# Patient Record
Sex: Female | Born: 1940 | Race: White | Hispanic: No | Smoking: Former smoker
Health system: Southern US, Community
[De-identification: ages and names within clinical notes are randomized; demographics above are authoritative.]

## PROBLEM LIST (undated history)

## (undated) DIAGNOSIS — R419 Unspecified symptoms and signs involving cognitive functions and awareness: Secondary | ICD-10-CM

## (undated) DIAGNOSIS — M1990 Unspecified osteoarthritis, unspecified site: Secondary | ICD-10-CM

## (undated) DIAGNOSIS — E039 Hypothyroidism, unspecified: Secondary | ICD-10-CM

## (undated) DIAGNOSIS — Z87891 Personal history of nicotine dependence: Secondary | ICD-10-CM

## (undated) DIAGNOSIS — IMO0002 Reserved for concepts with insufficient information to code with codable children: Secondary | ICD-10-CM

## (undated) DIAGNOSIS — M549 Dorsalgia, unspecified: Secondary | ICD-10-CM

## (undated) DIAGNOSIS — E079 Disorder of thyroid, unspecified: Secondary | ICD-10-CM

## (undated) DIAGNOSIS — E785 Hyperlipidemia, unspecified: Secondary | ICD-10-CM

## (undated) DIAGNOSIS — Z8619 Personal history of other infectious and parasitic diseases: Secondary | ICD-10-CM

## (undated) DIAGNOSIS — G4700 Insomnia, unspecified: Secondary | ICD-10-CM

## (undated) HISTORY — PX: VARICOSE VEIN SURGERY: SHX832

## (undated) HISTORY — DX: Personal history of other infectious and parasitic diseases: Z86.19

## (undated) HISTORY — PX: WISDOM TOOTH EXTRACTION: SHX21

## (undated) HISTORY — DX: Insomnia, unspecified: G47.00

## (undated) HISTORY — DX: Dorsalgia, unspecified: M54.9

## (undated) HISTORY — DX: Personal history of nicotine dependence: Z87.891

## (undated) HISTORY — DX: Hypothyroidism, unspecified: E03.9

## (undated) HISTORY — DX: Unspecified osteoarthritis, unspecified site: M19.90

## (undated) HISTORY — PX: ABDOMINAL HYSTERECTOMY: SHX81

## (undated) HISTORY — DX: Hyperlipidemia, unspecified: E78.5

## (undated) HISTORY — PX: FRACTURE SURGERY: SHX138

## (undated) HISTORY — PX: TONSILLECTOMY: SHX5217

## (undated) HISTORY — DX: Disorder of thyroid, unspecified: E07.9

## (undated) HISTORY — DX: Reserved for concepts with insufficient information to code with codable children: IMO0002

---

## 2003-10-23 HISTORY — PX: OTHER SURGICAL HISTORY: SHX169

## 2013-08-04 DIAGNOSIS — M25561 Pain in right knee: Secondary | ICD-10-CM | POA: Insufficient documentation

## 2013-08-18 DIAGNOSIS — M1990 Unspecified osteoarthritis, unspecified site: Secondary | ICD-10-CM | POA: Insufficient documentation

## 2014-01-01 DIAGNOSIS — E079 Disorder of thyroid, unspecified: Secondary | ICD-10-CM

## 2014-01-01 HISTORY — DX: Disorder of thyroid, unspecified: E07.9

## 2014-03-26 ENCOUNTER — Other Ambulatory Visit: Payer: Self-pay | Admitting: Family Medicine

## 2014-03-26 ENCOUNTER — Encounter: Payer: Self-pay | Admitting: Family Medicine

## 2014-03-26 ENCOUNTER — Telehealth: Payer: Self-pay | Admitting: Family Medicine

## 2014-03-26 ENCOUNTER — Ambulatory Visit (INDEPENDENT_AMBULATORY_CARE_PROVIDER_SITE_OTHER): Payer: Medicare HMO | Admitting: Family Medicine

## 2014-03-26 VITALS — BP 140/82 | HR 73 | Temp 97.9°F | Ht 61.75 in | Wt 128.0 lb

## 2014-03-26 DIAGNOSIS — Z0000 Encounter for general adult medical examination without abnormal findings: Secondary | ICD-10-CM | POA: Insufficient documentation

## 2014-03-26 DIAGNOSIS — Z1231 Encounter for screening mammogram for malignant neoplasm of breast: Secondary | ICD-10-CM

## 2014-03-26 DIAGNOSIS — M129 Arthropathy, unspecified: Secondary | ICD-10-CM

## 2014-03-26 DIAGNOSIS — D649 Anemia, unspecified: Secondary | ICD-10-CM | POA: Insufficient documentation

## 2014-03-26 DIAGNOSIS — IMO0002 Reserved for concepts with insufficient information to code with codable children: Secondary | ICD-10-CM

## 2014-03-26 DIAGNOSIS — Z23 Encounter for immunization: Secondary | ICD-10-CM

## 2014-03-26 DIAGNOSIS — E039 Hypothyroidism, unspecified: Secondary | ICD-10-CM

## 2014-03-26 DIAGNOSIS — R5381 Other malaise: Secondary | ICD-10-CM | POA: Insufficient documentation

## 2014-03-26 DIAGNOSIS — Z87891 Personal history of nicotine dependence: Secondary | ICD-10-CM

## 2014-03-26 DIAGNOSIS — R5383 Other fatigue: Secondary | ICD-10-CM

## 2014-03-26 DIAGNOSIS — Z780 Asymptomatic menopausal state: Secondary | ICD-10-CM

## 2014-03-26 DIAGNOSIS — G4700 Insomnia, unspecified: Secondary | ICD-10-CM

## 2014-03-26 DIAGNOSIS — M1990 Unspecified osteoarthritis, unspecified site: Secondary | ICD-10-CM

## 2014-03-26 HISTORY — DX: Personal history of nicotine dependence: Z87.891

## 2014-03-26 HISTORY — DX: Reserved for concepts with insufficient information to code with codable children: IMO0002

## 2014-03-26 HISTORY — DX: Insomnia, unspecified: G47.00

## 2014-03-26 LAB — RENAL FUNCTION PANEL
ALBUMIN: 4.1 g/dL (ref 3.5–5.2)
BUN: 21 mg/dL (ref 6–23)
CHLORIDE: 104 meq/L (ref 96–112)
CO2: 28 mEq/L (ref 19–32)
Calcium: 8.9 mg/dL (ref 8.4–10.5)
Creat: 0.61 mg/dL (ref 0.50–1.10)
Glucose, Bld: 86 mg/dL (ref 70–99)
PHOSPHORUS: 3 mg/dL (ref 2.3–4.6)
Potassium: 3.9 mEq/L (ref 3.5–5.3)
SODIUM: 142 meq/L (ref 135–145)

## 2014-03-26 LAB — CBC
HCT: 39.5 % (ref 36.0–46.0)
Hemoglobin: 13.2 g/dL (ref 12.0–15.0)
MCH: 29.8 pg (ref 26.0–34.0)
MCHC: 33.4 g/dL (ref 30.0–36.0)
MCV: 89.2 fL (ref 78.0–100.0)
PLATELETS: 353 10*3/uL (ref 150–400)
RBC: 4.43 MIL/uL (ref 3.87–5.11)
RDW: 13.4 % (ref 11.5–15.5)
WBC: 10.4 10*3/uL (ref 4.0–10.5)

## 2014-03-26 MED ORDER — PNEUMOCOCCAL 13-VAL CONJ VACC IM SUSP: 0.5000 mL | Freq: Once | INTRAMUSCULAR | Status: DC

## 2014-03-26 MED ORDER — TRAMADOL HCL 50 MG PO TABS: 100.0000 mg | ORAL_TABLET | Freq: Four times a day (QID) | ORAL | Status: DC | PRN

## 2014-03-26 MED ORDER — LEVOTHYROXINE SODIUM 75 MCG PO TABS: 75.0000 ug | ORAL_TABLET | Freq: Every day | ORAL | Status: DC

## 2014-03-26 NOTE — Assessment & Plan Note (Signed)
Check a CBC

## 2014-03-26 NOTE — Assessment & Plan Note (Addendum)
Check a Dexa Scan. Having hot flashes, has tried estrogen in past without good results. Would discourage this until some screening is done anyway. Encouraged Icool, small, frequent meals with lean proteins and minimize simple carbs. Increase exercise

## 2014-03-26 NOTE — Assessment & Plan Note (Addendum)
Started smoking at 80 and quit at 83 after 1 1/2 ppd history. Uses an electronic cig bid, encouraged complete cessation.  Encouraged to proceed with MGM, Dexa scan and colonoscopy due to increased risk of disease with smoking history

## 2014-03-26 NOTE — Assessment & Plan Note (Signed)
Has done very little screening in her life. Reports 1 mgm roughly 10 years ago was normal. Has never had a colonoscopy, DEXA scan or cholesterol check. Declines colonoscopy. Agrees to DEXA scan and mammogram prior to next visit. We'll consider colonoscopy to be ordered at next visit after long discussion. Agrees to return for well-nourished check and Pap smear in roughly 3 months. Agrees to fasting labs at that time. Given Prevnar shot today. Agrees to Zostavax at next visit.

## 2014-03-26 NOTE — Progress Notes (Signed)
Pre visit review using our clinic review tool, if applicable. No additional management support is needed unless otherwise documented below in the visit note. 

## 2014-03-26 NOTE — Assessment & Plan Note (Signed)
Check TSH, CBC and renal. Increase exercise, eat heart healthy diet

## 2014-03-26 NOTE — Progress Notes (Signed)
Patient ID: Katherine Griffith, female   DOB: Oct 17, 1941, 73 y.o.   MRN: [id number] Katherine Griffith [id number] 01-Dec-1940 03/26/2014      Progress Note New Patient  Subjective  Chief Complaint  Chief Complaint  Patient presents with  . Establish Care    new patient  . Injections    prevnar    HPI  Patient is a 73 year old female in today for routine medical care. Patient is in today to establish care. She has been in the area for roughly 4-5 years after moving here from [place] to live with her daughter. She has been seeing an urgent medical care group as needed but is ready to establish care. Has not historically had ongoing primary care and has done very little screening in her past. Today her greatest complaints are of excessive fatigue and frequent hot flashes. She reports trying estrogen in the past with no good relief. No other acute complaints at the present time. She is in need of medication refills and does note chronic joint pain in her lower extremities for which tramadol is helpful. Denies CP/palp/SOB/HA/congestion/fevers/GI or GU c/o. Taking meds as prescribed  Past Medical History  Diagnosis Date  . Thyroid disease 01-01-14  . Arthritis   . History of chicken pox   . H/O measles   . H/O mumps     Past Surgical History  Procedure Laterality Date  . Abdominal hysterectomy  73 yrs old    partial  . Herniated disc  2005  . Tonsillectomy  51 yrs ago  . Wisdom tooth extraction    . Varicose vein surgery Bilateral     Family History  Problem Relation Age of Onset  . Cancer Father   . Cataracts Paternal Grandmother     History   Social History  . Marital Status: Divorced    Spouse Name: N/A    Number of Children: N/A  . Years of Education: N/A   Occupational History  . Not on file.   Social History Main Topics  . Smoking status: Former Smoker -- 1.50 packs/day for 53 years    Types: Cigarettes    Start date: 10/22/2010  . Smokeless tobacco: Not on file  .  Alcohol Use: No  . Drug Use: No  . Sexual Activity: Not Currently     Comment: lives with Daughter, Katherine Griffith, no dietary restrictions.   Other Topics Concern  . Not on file   Social History Narrative  . No narrative on file    No current outpatient prescriptions on file prior to visit.   No current facility-administered medications on file prior to visit.    Allergies  Allergen Reactions  . Codeine Nausea Only    Review of Systems  Review of Systems  Constitutional: Positive for malaise/fatigue. Negative for fever and chills.  HENT: Negative for congestion, hearing loss and nosebleeds.   Eyes: Negative for discharge.  Respiratory: Negative for cough, sputum production, shortness of breath and wheezing.   Cardiovascular: Negative for chest pain, palpitations and leg swelling.  Gastrointestinal: Negative for heartburn, nausea, vomiting, abdominal pain, diarrhea, constipation and blood in stool.  Genitourinary: Negative for dysuria, urgency, frequency and hematuria.  Musculoskeletal: Negative for back pain, falls and myalgias.  Skin: Negative for rash.  Neurological: Negative for dizziness, tremors, sensory change, focal weakness, loss of consciousness, weakness and headaches.  Endo/Heme/Allergies: Negative for polydipsia. Does not bruise/bleed easily.  Psychiatric/Behavioral: Negative for depression and suicidal ideas. The patient has insomnia. The patient is  not nervous/anxious.     Objective  BP 140/82  Pulse 73  Temp(Src) 97.9 F (36.6 C) (Oral)  Ht 5' 1.75" (1.568 m)  Wt 128 lb (58.06 kg)  BMI 23.61 kg/m2  SpO2 96%  Physical Exam  Physical Exam  Constitutional: She is oriented to person, place, and time and well-developed, well-nourished, and in no distress. No distress.  HENT:  Head: Normocephalic and atraumatic.  Right Ear: External ear normal.  Left Ear: External ear normal.  Nose: Nose normal.  Mouth/Throat: Oropharynx is clear and moist. No  oropharyngeal exudate.  Eyes: Conjunctivae are normal. Pupils are equal, round, and reactive to light. Right eye exhibits no discharge. Left eye exhibits no discharge. No scleral icterus.  Neck: Normal range of motion. Neck supple. No thyromegaly present.  Cardiovascular: Normal rate, regular rhythm, normal heart sounds and intact distal pulses.   No murmur heard. Pulmonary/Chest: Effort normal and breath sounds normal. No respiratory distress. She has no wheezes. She has no rales.  Abdominal: Soft. Bowel sounds are normal. She exhibits no distension and no mass. There is no tenderness.  Musculoskeletal: Normal range of motion. She exhibits no edema and no tenderness.  Lymphadenopathy:    She has no cervical adenopathy.  Neurological: She is alert and oriented to person, place, and time. She has normal reflexes. No cranial nerve deficit. Coordination normal.  Skin: Skin is warm and dry. No rash noted. She is not diaphoretic.  Psychiatric: Mood, memory and affect normal.       Assessment & Plan  History of tobacco use Started smoking at 17 and quit at 71 after 1 1/2 ppd history. Uses an electronic cig bid, encouraged complete cessation.  Encouraged to proceed with MGM, Dexa scan and colonoscopy due to increased risk of disease with smoking history  Preventative health care Has done very little screening in her life. Reports 1 mgm roughly 10 years ago was normal. Has never had a colonoscopy, DEXA scan or cholesterol check. Declines colonoscopy. Agrees to DEXA scan and mammogram prior to next visit. We'll consider colonoscopy to be ordered at next visit after long discussion. Agrees to return for well-nourished check and Pap smear in roughly 3 months. Agrees to fasting labs at that time. Given Prevnar shot today. Agrees to Zostavax at next visit.  Postmenopausal estrogen deficiency Check a Dexa Scan. Having hot flashes, has tried estrogen in past without good results. Would discourage this  until some screening is done anyway. Encouraged Icool, small, frequent meals with lean proteins and minimize simple carbs. Increase exercise  Anemia Check a CBC  Hypothyroid Given prescription for Levothyroxine and TSH checked today  Arthritis Gets adequate pain control with Tramadol 50 mg roughly twice daily, given refill today  DDD (degenerative disc disease) Had a low back discectomy years ago doing well at this time.  Other malaise and fatigue Check TSH, CBC and renal. Increase exercise, eat heart healthy diet  Insomnia Encouraged good sleep hygiene such as dark, quiet room. No blue/green glowing lights such as computer screens in bedroom. No alcohol or stimulants in evening. Cut down on caffeine as able. Regular exercise is helpful but not just prior to bed time. Consider Melatonin and Benadryl prn

## 2014-03-26 NOTE — Patient Instructions (Addendum)
Schedule Mammogram downstairs on first floor. Turn left getting off elevator Melatonin 5 to 10 mg in evening If no response can add Benadryl/Diphenhydramine 25 mg at bedtime Icool for hot flashes Small. Frequent meals with lean proteins, watch carbs Regular exercise Stay well hydrated    Preventive Care for Adults, Female A healthy lifestyle and preventive care can promote health and wellness. Preventive health guidelines for women include the following key practices.  A routine yearly physical is a good way to check with your health care provider about your health and preventive screening. It is a chance to share any concerns and updates on your health and to receive a thorough exam.  Visit your dentist for a routine exam and preventive care every 6 months. Brush your teeth twice a day and floss once a day. Good oral hygiene prevents tooth decay and gum disease.  The frequency of eye exams is based on your age, health, family medical history, use of contact lenses, and other factors. Follow your health care provider's recommendations for frequency of eye exams.  Eat a healthy diet. Foods like vegetables, fruits, whole grains, low-fat dairy products, and lean protein foods contain the nutrients you need without too many calories. Decrease your intake of foods high in solid fats, added sugars, and salt. Eat the right amount of calories for you.Get information about a proper diet from your health care provider, if necessary.  Regular physical exercise is one of the most important things you can do for your health. Most adults should get at least 150 minutes of moderate-intensity exercise (any activity that increases your heart rate and causes you to sweat) each week. In addition, most adults need muscle-strengthening exercises on 2 or more days a week.  Maintain a healthy weight. The body mass index (BMI) is a screening tool to identify possible weight problems. It provides an estimate of body  fat based on height and weight. Your health care provider can find your BMI, and can help you achieve or maintain a healthy weight.For adults 20 years and older:  A BMI below 18.5 is considered underweight.  A BMI of 18.5 to 24.9 is normal.  A BMI of 25 to 29.9 is considered overweight.  A BMI of 30 and above is considered obese.  Maintain normal blood lipids and cholesterol levels by exercising and minimizing your intake of saturated fat. Eat a balanced diet with plenty of fruit and vegetables. Blood tests for lipids and cholesterol should begin at age 4 and be repeated every 5 years. If your lipid or cholesterol levels are high, you are over 50, or you are at high risk for heart disease, you may need your cholesterol levels checked more frequently.Ongoing high lipid and cholesterol levels should be treated with medicines if diet and exercise are not working.  If you smoke, find out from your health care provider how to quit. If you do not use tobacco, do not start.  Lung cancer screening is recommended for adults aged 45 80 years who are at high risk for developing lung cancer because of a history of smoking. A yearly low-dose CT scan of the lungs is recommended for people who have at least a 30-pack-year history of smoking and are a current smoker or have quit within the past 15 years. A pack year of smoking is smoking an average of 1 pack of cigarettes a day for 1 year (for example: 1 pack a day for 30 years or 2 packs a day for 15  years). Yearly screening should continue until the smoker has stopped smoking for at least 15 years. Yearly screening should be stopped for people who develop a health problem that would prevent them from having lung cancer treatment.  If you are pregnant, do not drink alcohol. If you are breastfeeding, be very cautious about drinking alcohol. If you are not pregnant and choose to drink alcohol, do not have more than 1 drink per day. One drink is considered to be 12  ounces (355 mL) of beer, 5 ounces (148 mL) of wine, or 1.5 ounces (44 mL) of liquor.  Avoid use of street drugs. Do not share needles with anyone. Ask for help if you need support or instructions about stopping the use of drugs.  High blood pressure causes heart disease and increases the risk of stroke. Your blood pressure should be checked at least every 1 to 2 years. Ongoing high blood pressure should be treated with medicines if weight loss and exercise do not work.  If you are 82 72 years old, ask your health care provider if you should take aspirin to prevent strokes.  Diabetes screening involves taking a blood sample to check your fasting blood sugar level. This should be done once every 3 years, after age 98, if you are within normal weight and without risk factors for diabetes. Testing should be considered at a younger age or be carried out more frequently if you are overweight and have at least 1 risk factor for diabetes.  Breast cancer screening is essential preventive care for women. You should practice "breast self-awareness." This means understanding the normal appearance and feel of your breasts and may include breast self-examination. Any changes detected, no matter how small, should be reported to a health care provider. Women in their 80s and 30s should have a clinical breast exam (CBE) by a health care provider as part of a regular health exam every 1 to 3 years. After age 69, women should have a CBE every year. Starting at age 14, women should consider having a mammogram (breast X-ray test) every year. Women who have a family history of breast cancer should talk to their health care provider about genetic screening. Women at a high risk of breast cancer should talk to their health care providers about having an MRI and a mammogram every year.  Breast cancer gene (BRCA)-related cancer risk assessment is recommended for women who have family members with BRCA-related cancers.  BRCA-related cancers include breast, ovarian, tubal, and peritoneal cancers. Having family members with these cancers may be associated with an increased risk for harmful changes (mutations) in the breast cancer genes BRCA1 and BRCA2. Results of the assessment will determine the need for genetic counseling and BRCA1 and BRCA2 testing.  The Pap test is a screening test for cervical cancer. A Pap test can show cell changes on the cervix that might become cervical cancer if left untreated. A Pap test is a procedure in which cells are obtained and examined from the lower end of the uterus (cervix).  Women should have a Pap test starting at age 74.  Between ages 1 and 73, Pap tests should be repeated every 2 years.  Beginning at age 80, you should have a Pap test every 3 years as long as the past 3 Pap tests have been normal.  Some women have medical problems that increase the chance of getting cervical cancer. Talk to your health care provider about these problems. It is especially important to talk  to your health care provider if a new problem develops soon after your last Pap test. In these cases, your health care provider may recommend more frequent screening and Pap tests.  The above recommendations are the same for women who have or have not gotten the vaccine for human papillomavirus (HPV).  If you had a hysterectomy for a problem that was not cancer or a condition that could lead to cancer, then you no longer need Pap tests. Even if you no longer need a Pap test, a regular exam is a good idea to make sure no other problems are starting.  If you are between ages 14 and 29 years, and you have had normal Pap tests going back 10 years, you no longer need Pap tests. Even if you no longer need a Pap test, a regular exam is a good idea to make sure no other problems are starting.  If you have had past treatment for cervical cancer or a condition that could lead to cancer, you need Pap tests and  screening for cancer for at least 20 years after your treatment.  If Pap tests have been discontinued, risk factors (such as a new sexual partner) need to be reassessed to determine if screening should be resumed.  The HPV test is an additional test that may be used for cervical cancer screening. The HPV test looks for the virus that can cause the cell changes on the cervix. The cells collected during the Pap test can be tested for HPV. The HPV test could be used to screen women aged 3 years and older, and should be used in women of any age who have unclear Pap test results. After the age of 21, women should have HPV testing at the same frequency as a Pap test.  Colorectal cancer can be detected and often prevented. Most routine colorectal cancer screening begins at the age of 49 years and continues through age 32 years. However, your health care provider may recommend screening at an earlier age if you have risk factors for colon cancer. On a yearly basis, your health care provider may provide home test kits to check for hidden blood in the stool. Use of a small camera at the end of a tube, to directly examine the colon (sigmoidoscopy or colonoscopy), can detect the earliest forms of colorectal cancer. Talk to your health care provider about this at age 44, when routine screening begins. Direct exam of the colon should be repeated every 5 10 years through age 22 years, unless early forms of pre-cancerous polyps or small growths are found.  People who are at an increased risk for hepatitis B should be screened for this virus. You are considered at high risk for hepatitis B if:  You were born in a country where hepatitis B occurs often. Talk with your health care provider about which countries are considered high risk.  Your parents were born in a high-risk country and you have not received a shot to protect against hepatitis B (hepatitis B vaccine).  You have HIV or AIDS.  You use needles to inject  street drugs.  You live with, or have sex with, someone who has Hepatitis B.  You get hemodialysis treatment.  You take certain medicines for conditions like cancer, organ transplantation, and autoimmune conditions.  Hepatitis C blood testing is recommended for all people born from [birth year] through 1965 and any individual with known risks for hepatitis C.  Practice safe sex. Use condoms and avoid high-risk  sexual practices to reduce the spread of sexually transmitted infections (STIs). STIs include gonorrhea, chlamydia, syphilis, trichomonas, herpes, HPV, and human immunodeficiency virus (HIV). Herpes, HIV, and HPV are viral illnesses that have no cure. They can result in disability, cancer, and death. Sexually active women aged 39 years and younger should be checked for chlamydia. Older women with new or multiple partners should also be tested for chlamydia. Testing for other STIs is recommended if you are sexually active and at increased risk.  Osteoporosis is a disease in which the bones lose minerals and strength with aging. This can result in serious bone fractures or breaks. The risk of osteoporosis can be identified using a bone density scan. Women ages 51 years and over and women at risk for fractures or osteoporosis should discuss screening with their health care providers. Ask your health care provider whether you should take a calcium supplement or vitamin D to reduce the rate of osteoporosis.  Menopause can be associated with physical symptoms and risks. Hormone replacement therapy is available to decrease symptoms and risks. You should talk to your health care provider about whether hormone replacement therapy is right for you.  Use sunscreen. Apply sunscreen liberally and repeatedly throughout the day. You should seek shade when your shadow is shorter than you. Protect yourself by wearing long sleeves, pants, a wide-brimmed hat, and sunglasses year round, whenever you are outdoors.  Once  a month, do a whole body skin exam, using a mirror to look at the skin on your back. Tell your health care provider of new moles, moles that have irregular borders, moles that are larger than a pencil eraser, or moles that have changed in shape or color.  Stay current with required vaccines (immunizations).  Influenza vaccine. All adults should be immunized every year.  Tetanus, diphtheria, and acellular pertussis (Td, Tdap) vaccine. Pregnant women should receive 1 dose of Tdap vaccine during each pregnancy. The dose should be obtained regardless of the length of time since the last dose. Immunization is preferred during the 27th 36th week of gestation. An adult who has not previously received Tdap or who does not know her vaccine status should receive 1 dose of Tdap. This initial dose should be followed by tetanus and diphtheria toxoids (Td) booster doses every 10 years. Adults with an unknown or incomplete history of completing a 3-dose immunization series with Td-containing vaccines should begin or complete a primary immunization series including a Tdap dose. Adults should receive a Td booster every 10 years.  Varicella vaccine. An adult without evidence of immunity to varicella should receive 2 doses or a second dose if she has previously received 1 dose. Pregnant females who do not have evidence of immunity should receive the first dose after pregnancy. This first dose should be obtained before leaving the health care facility. The second dose should be obtained 4 8 weeks after the first dose.  Human papillomavirus (HPV) vaccine. Females aged 82 26 years who have not received the vaccine previously should obtain the 3-dose series. The vaccine is not recommended for use in pregnant females. However, pregnancy testing is not needed before receiving a dose. If a female is found to be pregnant after receiving a dose, no treatment is needed. In that case, the remaining doses should be delayed until after  the pregnancy. Immunization is recommended for any person with an immunocompromised condition through the age of 56 years if she did not get any or all doses earlier. During the 3-dose series,  the second dose should be obtained 4 8 weeks after the first dose. The third dose should be obtained 24 weeks after the first dose and 16 weeks after the second dose.  Zoster vaccine. One dose is recommended for adults aged 63 years or older unless certain conditions are present.  Measles, mumps, and rubella (MMR) vaccine. Adults born before [birth year] generally are considered immune to measles and mumps. Adults born in [birth year] or later should have 1 or more doses of MMR vaccine unless there is a contraindication to the vaccine or there is laboratory evidence of immunity to each of the three diseases. A routine second dose of MMR vaccine should be obtained at least 28 days after the first dose for students attending postsecondary schools, health care workers, or international travelers. People who received inactivated measles vaccine or an unknown type of measles vaccine during 1963 1967 should receive 2 doses of MMR vaccine. People who received inactivated mumps vaccine or an unknown type of mumps vaccine before 1979 and are at high risk for mumps infection should consider immunization with 2 doses of MMR vaccine. For females of childbearing age, rubella immunity should be determined. If there is no evidence of immunity, females who are not pregnant should be vaccinated. If there is no evidence of immunity, females who are pregnant should delay immunization until after pregnancy. Unvaccinated health care workers born before [birth year] who lack laboratory evidence of measles, mumps, or rubella immunity or laboratory confirmation of disease should consider measles and mumps immunization with 2 doses of MMR vaccine or rubella immunization with 1 dose of MMR vaccine.  Pneumococcal 13-valent conjugate (PCV13) vaccine. When indicated, a  person who is uncertain of her immunization history and has no record of immunization should receive the PCV13 vaccine. An adult aged 75 years or older who has certain medical conditions and has not been previously immunized should receive 1 dose of PCV13 vaccine. This PCV13 should be followed with a dose of pneumococcal polysaccharide (PPSV23) vaccine. The PPSV23 vaccine dose should be obtained at least 8 weeks after the dose of PCV13 vaccine. An adult aged 19 years or older who has certain medical conditions and previously received 1 or more doses of PPSV23 vaccine should receive 1 dose of PCV13. The PCV13 vaccine dose should be obtained 1 or more years after the last PPSV23 vaccine dose.  Pneumococcal polysaccharide (PPSV23) vaccine. When PCV13 is also indicated, PCV13 should be obtained first. All adults aged 7 years and older should be immunized. An adult younger than age 24 years who has certain medical conditions should be immunized. Any person who resides in a nursing home or long-term care facility should be immunized. An adult smoker should be immunized. People with an immunocompromised condition and certain other conditions should receive both PCV13 and PPSV23 vaccines. People with human immunodeficiency virus (HIV) infection should be immunized as soon as possible after diagnosis. Immunization during chemotherapy or radiation therapy should be avoided. Routine use of PPSV23 vaccine is not recommended for American Indians, [place] Natives, or people younger than 65 years unless there are medical conditions that require PPSV23 vaccine. When indicated, people who have unknown immunization and have no record of immunization should receive PPSV23 vaccine. One-time revaccination 5 years after the first dose of PPSV23 is recommended for people aged 21 64 years who have chronic kidney failure, nephrotic syndrome, asplenia, or immunocompromised conditions. People who received 1 2 doses of PPSV23 before age 14  years should receive another dose of PPSV23 vaccine  at age 33 years or later if at least 5 years have passed since the previous dose. Doses of PPSV23 are not needed for people immunized with PPSV23 at or after age 25 years.  Meningococcal vaccine. Adults with asplenia or persistent complement component deficiencies should receive 2 doses of quadrivalent meningococcal conjugate (MenACWY-D) vaccine. The doses should be obtained at least 2 months apart. Microbiologists working with certain meningococcal bacteria, [employer] recruits, people at risk during an outbreak, and people who travel to or live in countries with a high rate of meningitis should be immunized. A first-year college student up through age 83 years who is living in a residence hall should receive a dose if she did not receive a dose on or after her 16th birthday. Adults who have certain high-risk conditions should receive one or more doses of vaccine.  Hepatitis A vaccine. Adults who wish to be protected from this disease, have certain high-risk conditions, work with hepatitis A-infected animals, work in hepatitis A research labs, or travel to or work in countries with a high rate of hepatitis A should be immunized. Adults who were previously unvaccinated and who anticipate close contact with an international adoptee during the first 60 days after arrival in the [place] from a country with a high rate of hepatitis A should be immunized.  Hepatitis B vaccine. Adults who wish to be protected from this disease, have certain high-risk conditions, may be exposed to blood or other infectious body fluids, are household contacts or sex partners of hepatitis B positive people, are clients or workers in certain care facilities, or travel to or work in countries with a high rate of hepatitis B should be immunized.  Haemophilus influenzae type b (Hib) vaccine. A previously unvaccinated person with asplenia or sickle cell disease or having a scheduled  splenectomy should receive 1 dose of Hib vaccine. Regardless of previous immunization, a recipient of a hematopoietic stem cell transplant should receive a 3-dose series 6 12 months after her successful transplant. Hib vaccine is not recommended for adults with HIV infection. Preventive Services / Frequency Ages 6 to 39years  Blood pressure check.** / Every 1 to 2 years.  Lipid and cholesterol check.** / Every 5 years beginning at age 48.  Clinical breast exam.** / Every 3 years for women in their 73s and 49s.  BRCA-related cancer risk assessment.** / For women who have family members with a BRCA-related cancer (breast, ovarian, tubal, or peritoneal cancers).  Pap test.** / Every 2 years from ages 64 through 55. Every 3 years starting at age 29 through age 36 or 35 with a history of 3 consecutive normal Pap tests.  HPV screening.** / Every 3 years from ages 26 through ages 30 to 18 with a history of 3 consecutive normal Pap tests.  Hepatitis C blood test.** / For any individual with known risks for hepatitis C.  Skin self-exam. / Monthly.  Influenza vaccine. / Every year.  Tetanus, diphtheria, and acellular pertussis (Tdap, Td) vaccine.** / Consult your health care provider. Pregnant women should receive 1 dose of Tdap vaccine during each pregnancy. 1 dose of Td every 10 years.  Varicella vaccine.** / Consult your health care provider. Pregnant females who do not have evidence of immunity should receive the first dose after pregnancy.  HPV vaccine. / 3 doses over 6 months, if 88 and younger. The vaccine is not recommended for use in pregnant females. However, pregnancy testing is not needed before receiving a dose.  Measles, mumps,  rubella (MMR) vaccine.** / You need at least 1 dose of MMR if you were born in 1957 or later. You may also need a 2nd dose. For females of childbearing age, rubella immunity should be determined. If there is no evidence of immunity, females who are not  pregnant should be vaccinated. If there is no evidence of immunity, females who are pregnant should delay immunization until after pregnancy.  Pneumococcal 13-valent conjugate (PCV13) vaccine.** / Consult your health care provider.  Pneumococcal polysaccharide (PPSV23) vaccine.** / 1 to 2 doses if you smoke cigarettes or if you have certain conditions.  Meningococcal vaccine.** / 1 dose if you are age 14 to 60 years and a Market researcher living in a residence hall, or have one of several medical conditions, you need to get vaccinated against meningococcal disease. You may also need additional booster doses.  Hepatitis A vaccine.** / Consult your health care provider.  Hepatitis B vaccine.** / Consult your health care provider.  Haemophilus influenzae type b (Hib) vaccine.** / Consult your health care provider. Ages 28 to 64years  Blood pressure check.** / Every 1 to 2 years.  Lipid and cholesterol check.** / Every 5 years beginning at age 78 years.  Lung cancer screening. / Every year if you are aged 42 80 years and have a 30-pack-year history of smoking and currently smoke or have quit within the past 15 years. Yearly screening is stopped once you have quit smoking for at least 15 years or develop a health problem that would prevent you from having lung cancer treatment.  Clinical breast exam.** / Every year after age 86 years.  BRCA-related cancer risk assessment.** / For women who have family members with a BRCA-related cancer (breast, ovarian, tubal, or peritoneal cancers).  Mammogram.** / Every year beginning at age 8 years and continuing for as long as you are in good health. Consult with your health care provider.  Pap test.** / Every 3 years starting at age 39 years through age 67 or 62 years with a history of 3 consecutive normal Pap tests.  HPV screening.** / Every 3 years from ages 61 years through ages 59 to 101 years with a history of 3 consecutive normal Pap  tests.  Fecal occult blood test (FOBT) of stool. / Every year beginning at age 48 years and continuing until age 65 years. You may not need to do this test if you get a colonoscopy every 10 years.  Flexible sigmoidoscopy or colonoscopy.** / Every 5 years for a flexible sigmoidoscopy or every 10 years for a colonoscopy beginning at age 46 years and continuing until age 63 years.  Hepatitis C blood test.** / For all people born from [birth year] through 1964 and any individual with known risks for hepatitis C.  Skin self-exam. / Monthly.  Influenza vaccine. / Every year.  Tetanus, diphtheria, and acellular pertussis (Tdap/Td) vaccine.** / Consult your health care provider. Pregnant women should receive 1 dose of Tdap vaccine during each pregnancy. 1 dose of Td every 10 years.  Varicella vaccine.** / Consult your health care provider. Pregnant females who do not have evidence of immunity should receive the first dose after pregnancy.  Zoster vaccine.** / 1 dose for adults aged 90 years or older.  Measles, mumps, rubella (MMR) vaccine.** / You need at least 1 dose of MMR if you were born in 1957 or later. You may also need a 2nd dose. For females of childbearing age, rubella immunity should be determined. If there is  no evidence of immunity, females who are not pregnant should be vaccinated. If there is no evidence of immunity, females who are pregnant should delay immunization until after pregnancy.  Pneumococcal 13-valent conjugate (PCV13) vaccine.** / Consult your health care provider.  Pneumococcal polysaccharide (PPSV23) vaccine.** / 1 to 2 doses if you smoke cigarettes or if you have certain conditions.  Meningococcal vaccine.** / Consult your health care provider.  Hepatitis A vaccine.** / Consult your health care provider.  Hepatitis B vaccine.** / Consult your health care provider.  Haemophilus influenzae type b (Hib) vaccine.** / Consult your health care provider. Ages 14 years and  over  Blood pressure check.** / Every 1 to 2 years.  Lipid and cholesterol check.** / Every 5 years beginning at age 61 years.  Lung cancer screening. / Every year if you are aged 72 80 years and have a 30-pack-year history of smoking and currently smoke or have quit within the past 15 years. Yearly screening is stopped once you have quit smoking for at least 15 years or develop a health problem that would prevent you from having lung cancer treatment.  Clinical breast exam.** / Every year after age 81 years.  BRCA-related cancer risk assessment.** / For women who have family members with a BRCA-related cancer (breast, ovarian, tubal, or peritoneal cancers).  Mammogram.** / Every year beginning at age 56 years and continuing for as long as you are in good health. Consult with your health care provider.  Pap test.** / Every 3 years starting at age 21 years through age 76 or 68 years with 3 consecutive normal Pap tests. Testing can be stopped between 65 and 70 years with 3 consecutive normal Pap tests and no abnormal Pap or HPV tests in the past 10 years.  HPV screening.** / Every 3 years from ages 10 years through ages 70 or 37 years with a history of 3 consecutive normal Pap tests. Testing can be stopped between 65 and 70 years with 3 consecutive normal Pap tests and no abnormal Pap or HPV tests in the past 10 years.  Fecal occult blood test (FOBT) of stool. / Every year beginning at age 72 years and continuing until age 75 years. You may not need to do this test if you get a colonoscopy every 10 years.  Flexible sigmoidoscopy or colonoscopy.** / Every 5 years for a flexible sigmoidoscopy or every 10 years for a colonoscopy beginning at age 81 years and continuing until age 81 years.  Hepatitis C blood test.** / For all people born from [birth year] through 1964 and any individual with known risks for hepatitis C.  Osteoporosis screening.** / A one-time screening for women ages 65 years and over and  women at risk for fractures or osteoporosis.  Skin self-exam. / Monthly.  Influenza vaccine. / Every year.  Tetanus, diphtheria, and acellular pertussis (Tdap/Td) vaccine.** / 1 dose of Td every 10 years.  Varicella vaccine.** / Consult your health care provider.  Zoster vaccine.** / 1 dose for adults aged 46 years or older.  Pneumococcal 13-valent conjugate (PCV13) vaccine.** / Consult your health care provider.  Pneumococcal polysaccharide (PPSV23) vaccine.** / 1 dose for all adults aged 81 years and older.  Meningococcal vaccine.** / Consult your health care provider.  Hepatitis A vaccine.** / Consult your health care provider.  Hepatitis B vaccine.** / Consult your health care provider.  Haemophilus influenzae type b (Hib) vaccine.** / Consult your health care provider. ** Family history and personal history of risk and  conditions may change your health care provider's recommendations. Document Released: 12/04/2001 Document Revised: 07/29/2013 Document Reviewed: 03/05/2011 [facility] Patient Information 2014 [place], [place].

## 2014-03-26 NOTE — Telephone Encounter (Signed)
Received medical records from [place]

## 2014-03-26 NOTE — Assessment & Plan Note (Signed)
Gets adequate pain control with Tramadol 50 mg roughly twice daily, given refill today

## 2014-03-26 NOTE — Assessment & Plan Note (Signed)
Had a low back discectomy years ago doing well at this time.

## 2014-03-26 NOTE — Assessment & Plan Note (Signed)
Encouraged good sleep hygiene such as dark, quiet room. No blue/green glowing lights such as computer screens in bedroom. No alcohol or stimulants in evening. Cut down on caffeine as able. Regular exercise is helpful but not just prior to bed time. Consider Melatonin and Benadryl prn

## 2014-03-26 NOTE — Assessment & Plan Note (Signed)
Given prescription for Levothyroxine and TSH checked today

## 2014-03-29 ENCOUNTER — Ambulatory Visit (HOSPITAL_BASED_OUTPATIENT_CLINIC_OR_DEPARTMENT_OTHER)
Admission: RE | Admit: 2014-03-29 | Discharge: 2014-03-29 | Disposition: A | Discharge status: Home / Self Care | Payer: Medicare HMO | Source: Ambulatory Visit | Attending: Family Medicine | Admitting: Family Medicine

## 2014-03-29 DIAGNOSIS — Z1231 Encounter for screening mammogram for malignant neoplasm of breast: Secondary | ICD-10-CM

## 2014-06-03 ENCOUNTER — Other Ambulatory Visit: Payer: Self-pay | Admitting: Family Medicine

## 2014-06-10 ENCOUNTER — Telehealth: Payer: Self-pay | Admitting: Family Medicine

## 2014-06-10 DIAGNOSIS — E039 Hypothyroidism, unspecified: Secondary | ICD-10-CM

## 2014-06-10 MED ORDER — LEVOTHYROXINE SODIUM 75 MCG PO TABS: 75.0000 ug | ORAL_TABLET | Freq: Every day | ORAL | Status: DC

## 2014-06-10 NOTE — Telephone Encounter (Signed)
Patient walked into office requesting a refill of levothyroxine to be sent to [facility] [address] in [place]

## 2014-08-10 ENCOUNTER — Ambulatory Visit (INDEPENDENT_AMBULATORY_CARE_PROVIDER_SITE_OTHER): Payer: Commercial Managed Care - HMO | Admitting: Family Medicine

## 2014-08-10 ENCOUNTER — Encounter: Payer: Self-pay | Admitting: Family Medicine

## 2014-08-10 VITALS — BP 153/95 | HR 77 | Temp 97.7°F | Ht 61.75 in | Wt 128.6 lb

## 2014-08-10 DIAGNOSIS — R509 Fever, unspecified: Secondary | ICD-10-CM

## 2014-08-10 DIAGNOSIS — M545 Low back pain: Secondary | ICD-10-CM

## 2014-08-10 DIAGNOSIS — IMO0001 Reserved for inherently not codable concepts without codable children: Secondary | ICD-10-CM

## 2014-08-10 DIAGNOSIS — R030 Elevated blood-pressure reading, without diagnosis of hypertension: Secondary | ICD-10-CM

## 2014-08-10 DIAGNOSIS — E039 Hypothyroidism, unspecified: Secondary | ICD-10-CM

## 2014-08-10 LAB — RENAL FUNCTION PANEL
ALBUMIN: 3.9 g/dL (ref 3.5–5.2)
BUN: 18 mg/dL (ref 6–23)
CALCIUM: 9.4 mg/dL (ref 8.4–10.5)
CO2: 27 meq/L (ref 19–32)
Chloride: 103 mEq/L (ref 96–112)
Creatinine, Ser: 0.8 mg/dL (ref 0.4–1.2)
GFR: 71.67 mL/min (ref >60.00)
Glucose, Bld: 95 mg/dL (ref 70–99)
PHOSPHORUS: 2.4 mg/dL (ref 2.3–4.6)
POTASSIUM: 3.6 meq/L (ref 3.5–5.1)
Sodium: 141 mEq/L (ref 135–145)

## 2014-08-10 LAB — HEPATIC FUNCTION PANEL
ALK PHOS: 96 U/L (ref 39–117)
ALT: 17 U/L (ref 0–35)
AST: 31 U/L (ref 0–37)
Albumin: 3.9 g/dL (ref 3.5–5.2)
BILIRUBIN DIRECT: 0 mg/dL (ref 0.0–0.3)
BILIRUBIN TOTAL: 0.5 mg/dL (ref 0.2–1.2)
Total Protein: 8.4 g/dL — ABNORMAL HIGH (ref 6.0–8.3)

## 2014-08-10 LAB — CBC
HCT: 43.4 % (ref 36.0–46.0)
Hemoglobin: 14.1 g/dL (ref 12.0–15.0)
MCHC: 32.4 g/dL (ref 30.0–36.0)
MCV: 94.1 fl (ref 78.0–100.0)
Platelets: 294 10*3/uL (ref 150.0–400.0)
RBC: 4.61 Mil/uL (ref 3.87–5.11)
RDW: 16.7 % — ABNORMAL HIGH (ref 11.5–15.5)
WBC: 7.5 10*3/uL (ref 4.0–10.5)

## 2014-08-10 LAB — TSH: TSH: 58.74 u[IU]/mL — AB (ref 0.35–4.50)

## 2014-08-10 MED ORDER — BACLOFEN 10 MG PO TABS: 10.0000 mg | ORAL_TABLET | Freq: Three times a day (TID) | ORAL | Status: DC | PRN

## 2014-08-10 MED ORDER — LEVOTHYROXINE SODIUM 112 MCG PO TABS: 112.0000 ug | ORAL_TABLET | Freq: Every day | ORAL | Status: DC

## 2014-08-10 MED ORDER — AMOXICILLIN 500 MG PO CAPS: 500.0000 mg | ORAL_CAPSULE | Freq: Three times a day (TID) | ORAL | Status: DC

## 2014-08-10 MED ORDER — TRAMADOL HCL 50 MG PO TABS: 50.0000 mg | ORAL_TABLET | Freq: Four times a day (QID) | ORAL | Status: DC | PRN

## 2014-08-10 NOTE — Progress Notes (Signed)
Pre visit review using our clinic review tool, if applicable. No additional management support is needed unless otherwise documented below in the visit note. 

## 2014-08-10 NOTE — Patient Instructions (Signed)
Try Salon Pas Gel  Encouraged increased rest and hydration, add probiotics such as Digestive Advantage or Hardin Negus colon health. Or generic, zinc such as Coldeze or Xicam. Treat fevers as needed Mucinex twice daily x 10 days   Upper Respiratory Infection, Adult An upper respiratory infection (URI) is also sometimes known as the common cold. The upper respiratory tract includes the nose, sinuses, throat, trachea, and bronchi. Bronchi are the airways leading to the lungs. Most people improve within 1 week, but symptoms can last up to 2 weeks. A residual cough may last even longer.  CAUSES Many different viruses can infect the tissues lining the upper respiratory tract. The tissues become irritated and inflamed and often become very moist. Mucus production is also common. A cold is contagious. You can easily spread the virus to others by oral contact. This includes kissing, sharing a glass, coughing, or sneezing. Touching your mouth or nose and then touching a surface, which is then touched by another person, can also spread the virus. SYMPTOMS  Symptoms typically develop 1 to 3 days after you come in contact with a cold virus. Symptoms vary from person to person. They may include:  Runny nose.  Sneezing.  Nasal congestion.  Sinus irritation.  Sore throat.  Loss of voice (laryngitis).  Cough.  Fatigue.  Muscle aches.  Loss of appetite.  Headache.  Low-grade fever. DIAGNOSIS  You might diagnose your own cold based on familiar symptoms, since most people get a cold 2 to 3 times a year. Your caregiver can confirm this based on your exam. Most importantly, your caregiver can check that your symptoms are not due to another disease such as strep throat, sinusitis, pneumonia, asthma, or epiglottitis. Blood tests, throat tests, and X-rays are not necessary to diagnose a common cold, but they may sometimes be helpful in excluding other more serious diseases. Your caregiver will decide if any  further tests are required. RISKS AND COMPLICATIONS  You may be at risk for a more severe case of the common cold if you smoke cigarettes, have chronic heart disease (such as heart failure) or lung disease (such as asthma), or if you have a weakened immune system. The very young and very old are also at risk for more serious infections. Bacterial sinusitis, middle ear infections, and bacterial pneumonia can complicate the common cold. The common cold can worsen asthma and chronic obstructive pulmonary disease (COPD). Sometimes, these complications can require emergency medical care and may be life-threatening. PREVENTION  The best way to protect against getting a cold is to practice good hygiene. Avoid oral or hand contact with people with cold symptoms. Wash your hands often if contact occurs. There is no clear evidence that vitamin C, vitamin E, echinacea, or exercise reduces the chance of developing a cold. However, it is always recommended to get plenty of rest and practice good nutrition. TREATMENT  Treatment is directed at relieving symptoms. There is no cure. Antibiotics are not effective, because the infection is caused by a virus, not by bacteria. Treatment may include:  Increased fluid intake. Sports drinks offer valuable electrolytes, sugars, and fluids.  Breathing heated mist or steam (vaporizer or shower).  Eating chicken soup or other clear broths, and maintaining good nutrition.  Getting plenty of rest.  Using gargles or lozenges for comfort.  Controlling fevers with ibuprofen or acetaminophen as directed by your caregiver.  Increasing usage of your inhaler if you have asthma. Zinc gel and zinc lozenges, taken in the first 24 hours  of the common cold, can shorten the duration and lessen the severity of symptoms. Pain medicines may help with fever, muscle aches, and throat pain. A variety of non-prescription medicines are available to treat congestion and runny nose. Your caregiver  can make recommendations and may suggest nasal or lung inhalers for other symptoms.  HOME CARE INSTRUCTIONS   Only take over-the-counter or prescription medicines for pain, discomfort, or fever as directed by your caregiver.  Use a warm mist humidifier or inhale steam from a shower to increase air moisture. This may keep secretions moist and make it easier to breathe.  Drink enough water and fluids to keep your urine clear or pale yellow.  Rest as needed.  Return to work when your temperature has returned to normal or as your caregiver advises. You may need to stay home longer to avoid infecting others. You can also use a face mask and careful hand washing to prevent spread of the virus. SEEK MEDICAL CARE IF:   After the first few days, you feel you are getting worse rather than better.  You need your caregiver's advice about medicines to control symptoms.  You develop chills, worsening shortness of breath, or brown or red sputum. These may be signs of pneumonia.  You develop yellow or brown nasal discharge or pain in the face, especially when you bend forward. These may be signs of sinusitis.  You develop a fever, swollen neck glands, pain with swallowing, or white areas in the back of your throat. These may be signs of strep throat. SEEK IMMEDIATE MEDICAL CARE IF:   You have a fever.  You develop severe or persistent headache, ear pain, sinus pain, or chest pain.  You develop wheezing, a prolonged cough, cough up blood, or have a change in your usual mucus (if you have chronic lung disease).  You develop sore muscles or a stiff neck. Document Released: 04/03/2001 Document Revised: 12/31/2011 Document Reviewed: 01/13/2014 [facility] Patient Information 2015 [place], [place]. This information is not intended to replace advice given to you by your health care provider. Make sure you discuss any questions you have with your health care provider.

## 2014-08-13 ENCOUNTER — Ambulatory Visit: Payer: Medicare HMO | Admitting: Family Medicine

## 2014-08-15 ENCOUNTER — Encounter: Payer: Self-pay | Admitting: Family Medicine

## 2014-08-15 DIAGNOSIS — J069 Acute upper respiratory infection, unspecified: Secondary | ICD-10-CM | POA: Insufficient documentation

## 2014-08-15 DIAGNOSIS — R030 Elevated blood-pressure reading, without diagnosis of hypertension: Secondary | ICD-10-CM

## 2014-08-15 DIAGNOSIS — M549 Dorsalgia, unspecified: Secondary | ICD-10-CM

## 2014-08-15 DIAGNOSIS — IMO0001 Reserved for inherently not codable concepts without codable children: Secondary | ICD-10-CM | POA: Insufficient documentation

## 2014-08-15 HISTORY — DX: Dorsalgia, unspecified: M54.9

## 2014-08-15 NOTE — Assessment & Plan Note (Signed)
TSH elevated, increase Levothyroxine and recheck Tsh in 8 weeks

## 2014-08-15 NOTE — Assessment & Plan Note (Signed)
Encouraged increased rest and hydration, add probiotics, zinc such as Coldeze or Xicam. Treat fevers as needed. Started on Amoxicillin

## 2014-08-15 NOTE — Progress Notes (Signed)
Patient ID: Katherine Griffith, female   DOB: 10/29/1940, 73 y.o.   MRN: [id number] Brianah Hopson [id number] 1941/03/30 08/15/2014      Progress Note-Follow Up  Subjective  Chief Complaint  Chief Complaint  Patient presents with  . ears "clogged"    X 1 week  . Sore Throat    X 4 days  . Cough    X 4 days w/phlegm, no color    HPI  Patient is a 73 year old female in today for routine medical care. Is complaining of 3 weeks of feeling off balance. No n/v. Hs been struggling with increased congestion, sore throat, cough for roughly 4 days. Notes fevers, malaise, myalgias, fatigue. No hearing loss, no sputum production. Denies CP/palp/SOB/GI or GU c/o. Taking meds as prescribed  Past Medical History  Diagnosis Date  . Thyroid disease 01-01-14  . Arthritis   . History of chicken pox   . H/O measles   . H/O mumps   . Preventative health care 03/26/2014  . History of tobacco use 03/26/2014  . Hypothyroid   . DDD (degenerative disc disease) 03/26/2014  . Insomnia 03/26/2014  . Back pain 08/15/2014  . Upper respiratory infection 08/15/2014    Past Surgical History  Procedure Laterality Date  . Abdominal hysterectomy  73 yrs old    partial  . Herniated disc  2005  . Tonsillectomy  51 yrs ago  . Wisdom tooth extraction    . Varicose vein surgery Bilateral     Family History  Problem Relation Age of Onset  . Cancer Father   . Cataracts Paternal Grandmother     History   Social History  . Marital Status: Divorced    Spouse Name: N/A    Number of Children: N/A  . Years of Education: N/A   Occupational History  . Not on file.   Social History Main Topics  . Smoking status: Former Smoker -- 1.50 packs/day for 53 years    Types: Cigarettes    Start date: 10/22/2010  . Smokeless tobacco: Not on file  . Alcohol Use: No  . Drug Use: No  . Sexual Activity: Not Currently     Comment: lives with Daughter, Venetia Night, no dietary restrictions.   Other Topics Concern  . Not on file    Social History Narrative  . No narrative on file    No current outpatient prescriptions on file prior to visit.   No current facility-administered medications on file prior to visit.    Allergies  Allergen Reactions  . Codeine Nausea Only    Review of Systems  Review of Systems  Constitutional: Positive for fever and malaise/fatigue.  HENT: Positive for congestion and ear pain.   Eyes: Negative for discharge.  Respiratory: Negative for shortness of breath.   Cardiovascular: Negative for chest pain, palpitations and leg swelling.  Gastrointestinal: Negative for nausea, abdominal pain and diarrhea.  Genitourinary: Negative for dysuria.  Musculoskeletal: Positive for myalgias. Negative for falls.  Skin: Negative for rash.  Neurological: Positive for dizziness and headaches. Negative for loss of consciousness.  Endo/Heme/Allergies: Negative for polydipsia.  Psychiatric/Behavioral: Negative for depression and suicidal ideas. The patient is not nervous/anxious and does not have insomnia.     Objective  BP 153/95  Pulse 77  Temp(Src) 97.7 F (36.5 C) (Oral)  Ht 5' 1.75" (1.568 m)  Wt 128 lb 9.6 oz (58.333 kg)  BMI 23.73 kg/m2  SpO2 99%  Physical Exam  Physical Exam  Constitutional: She is oriented  to person, place, and time and well-developed, well-nourished, and in no distress. No distress.  HENT:  Head: Normocephalic and atraumatic.  Eyes: Conjunctivae are normal.  Neck: Neck supple. No thyromegaly present.  Cardiovascular: Normal rate, regular rhythm and normal heart sounds.   No murmur heard. Pulmonary/Chest: Effort normal and breath sounds normal. She has no wheezes.  Abdominal: She exhibits no distension and no mass.  Musculoskeletal: She exhibits no edema.  Lymphadenopathy:    She has no cervical adenopathy.  Neurological: She is alert and oriented to person, place, and time.  Skin: Skin is warm and dry. No rash noted. She is not diaphoretic.   Psychiatric: Memory, affect and judgment normal.    Lab Results  Component Value Date   TSH 58.74* 08/10/2014   Lab Results  Component Value Date   WBC 7.5 08/10/2014   HGB 14.1 08/10/2014   HCT 43.4 08/10/2014   MCV 94.1 08/10/2014   PLT 294.0 08/10/2014   Lab Results  Component Value Date   CREATININE 0.8 08/10/2014   BUN 18 08/10/2014   NA 141 08/10/2014   K 3.6 08/10/2014   CL 103 08/10/2014   CO2 27 08/10/2014   Lab Results  Component Value Date   ALT 17 08/10/2014   AST 31 08/10/2014   ALKPHOS 96 08/10/2014   BILITOT 0.5 08/10/2014     Assessment & Plan  Elevated BP Poor controlled, no changes to meds. Likely elevated due to illness. Encouraged heart healthy diet such as the DASH diet and exercise as tolerated.   Back pain Encouraged moist heat and gentle stretching as tolerated. May try NSAIDs and prescription meds as directed and report if symptoms worsen or seek immediate care. Baclofen prn  Upper respiratory infection Encouraged increased rest and hydration, add probiotics, zinc such as Coldeze or Xicam. Treat fevers as needed. Started on Amoxicillin  Hypothyroid TSH elevated, increase Levothyroxine and recheck Tsh in 8 weeks

## 2014-08-15 NOTE — Assessment & Plan Note (Signed)
Poor controlled, no changes to meds. Likely elevated due to illness. Encouraged heart healthy diet such as the DASH diet and exercise as tolerated.

## 2014-08-15 NOTE — Assessment & Plan Note (Signed)
Encouraged moist heat and gentle stretching as tolerated. May try NSAIDs and prescription meds as directed and report if symptoms worsen or seek immediate care. Baclofen prn

## 2014-10-07 ENCOUNTER — Other Ambulatory Visit: Payer: Self-pay | Admitting: Family Medicine

## 2014-10-08 ENCOUNTER — Other Ambulatory Visit: Payer: Self-pay | Admitting: Family Medicine

## 2014-10-11 NOTE — Telephone Encounter (Signed)
Rx was denied it was filled on (10/07/14).//AB/CMA

## 2015-01-28 ENCOUNTER — Ambulatory Visit: Payer: Commercial Managed Care - HMO | Admitting: Family Medicine

## 2015-01-28 DIAGNOSIS — Z0289 Encounter for other administrative examinations: Secondary | ICD-10-CM

## 2015-02-04 ENCOUNTER — Telehealth: Payer: Self-pay | Admitting: Family Medicine

## 2015-02-04 ENCOUNTER — Encounter: Payer: Self-pay | Admitting: Family Medicine

## 2015-02-04 NOTE — Telephone Encounter (Signed)
Pt was no show for [facility] visit on 01/28/15- letter sent- charge?

## 2015-02-04 NOTE — Telephone Encounter (Signed)
yes

## 2015-03-11 ENCOUNTER — Telehealth: Payer: Self-pay | Admitting: Family Medicine

## 2015-03-11 NOTE — Telephone Encounter (Signed)
TRAMADOL RX 10-07-2014 NOT PICKED UP SHREDDED

## 2015-04-17 DIAGNOSIS — E031 Congenital hypothyroidism without goiter: Secondary | ICD-10-CM | POA: Diagnosis not present

## 2015-04-17 DIAGNOSIS — G609 Hereditary and idiopathic neuropathy, unspecified: Secondary | ICD-10-CM | POA: Diagnosis not present

## 2015-04-24 DIAGNOSIS — G4700 Insomnia, unspecified: Secondary | ICD-10-CM | POA: Diagnosis not present

## 2015-04-24 DIAGNOSIS — G609 Hereditary and idiopathic neuropathy, unspecified: Secondary | ICD-10-CM | POA: Diagnosis not present

## 2015-04-24 DIAGNOSIS — E031 Congenital hypothyroidism without goiter: Secondary | ICD-10-CM | POA: Diagnosis not present

## 2015-09-29 ENCOUNTER — Telehealth: Payer: Self-pay

## 2015-09-29 NOTE — Telephone Encounter (Signed)
Called and No answer, patient needs to schedule annual wellness visit with RN

## 2015-11-17 DIAGNOSIS — G609 Hereditary and idiopathic neuropathy, unspecified: Secondary | ICD-10-CM | POA: Diagnosis not present

## 2015-11-17 DIAGNOSIS — M79605 Pain in left leg: Secondary | ICD-10-CM | POA: Diagnosis not present

## 2015-11-17 DIAGNOSIS — M629 Disorder of muscle, unspecified: Secondary | ICD-10-CM | POA: Diagnosis not present

## 2015-11-17 DIAGNOSIS — E031 Congenital hypothyroidism without goiter: Secondary | ICD-10-CM | POA: Diagnosis not present

## 2015-11-17 DIAGNOSIS — M79604 Pain in right leg: Secondary | ICD-10-CM | POA: Diagnosis not present

## 2016-05-02 DIAGNOSIS — E031 Congenital hypothyroidism without goiter: Secondary | ICD-10-CM | POA: Diagnosis not present

## 2016-05-04 DIAGNOSIS — W19XXXA Unspecified fall, initial encounter: Secondary | ICD-10-CM | POA: Diagnosis not present

## 2016-05-04 DIAGNOSIS — S59911A Unspecified injury of right forearm, initial encounter: Secondary | ICD-10-CM | POA: Diagnosis not present

## 2016-05-04 DIAGNOSIS — M25521 Pain in right elbow: Secondary | ICD-10-CM | POA: Diagnosis not present

## 2016-05-04 DIAGNOSIS — S59901A Unspecified injury of right elbow, initial encounter: Secondary | ICD-10-CM | POA: Diagnosis not present

## 2016-05-04 DIAGNOSIS — S6991XA Unspecified injury of right wrist, hand and finger(s), initial encounter: Secondary | ICD-10-CM | POA: Diagnosis not present

## 2016-05-04 DIAGNOSIS — M79631 Pain in right forearm: Secondary | ICD-10-CM | POA: Diagnosis not present

## 2016-05-04 DIAGNOSIS — W06XXXA Fall from bed, initial encounter: Secondary | ICD-10-CM | POA: Diagnosis not present

## 2016-06-06 DIAGNOSIS — M810 Age-related osteoporosis without current pathological fracture: Secondary | ICD-10-CM | POA: Diagnosis not present

## 2016-06-06 DIAGNOSIS — E039 Hypothyroidism, unspecified: Secondary | ICD-10-CM | POA: Diagnosis not present

## 2016-06-22 DIAGNOSIS — M79606 Pain in leg, unspecified: Secondary | ICD-10-CM | POA: Diagnosis not present

## 2016-06-22 DIAGNOSIS — E031 Congenital hypothyroidism without goiter: Secondary | ICD-10-CM | POA: Diagnosis not present

## 2016-07-16 DIAGNOSIS — M79604 Pain in right leg: Secondary | ICD-10-CM | POA: Diagnosis not present

## 2016-07-16 DIAGNOSIS — M1990 Unspecified osteoarthritis, unspecified site: Secondary | ICD-10-CM | POA: Diagnosis not present

## 2016-07-16 DIAGNOSIS — M791 Myalgia: Secondary | ICD-10-CM | POA: Diagnosis not present

## 2016-07-16 DIAGNOSIS — R5383 Other fatigue: Secondary | ICD-10-CM | POA: Diagnosis not present

## 2016-07-16 DIAGNOSIS — G479 Sleep disorder, unspecified: Secondary | ICD-10-CM | POA: Diagnosis not present

## 2016-07-16 DIAGNOSIS — Z0184 Encounter for antibody response examination: Secondary | ICD-10-CM | POA: Diagnosis not present

## 2016-07-16 DIAGNOSIS — R0602 Shortness of breath: Secondary | ICD-10-CM | POA: Diagnosis not present

## 2016-07-16 DIAGNOSIS — E039 Hypothyroidism, unspecified: Secondary | ICD-10-CM | POA: Diagnosis not present

## 2016-07-16 DIAGNOSIS — M5137 Other intervertebral disc degeneration, lumbosacral region: Secondary | ICD-10-CM | POA: Diagnosis not present

## 2016-09-27 ENCOUNTER — Telehealth: Payer: Self-pay | Admitting: Family Medicine

## 2016-09-27 NOTE — Telephone Encounter (Signed)
Reached out to patient to schedule medicare wellness appointment, unable to lvm

## 2016-11-19 DIAGNOSIS — G2581 Restless legs syndrome: Secondary | ICD-10-CM | POA: Diagnosis not present

## 2017-01-23 DIAGNOSIS — Z1231 Encounter for screening mammogram for malignant neoplasm of breast: Secondary | ICD-10-CM | POA: Diagnosis not present

## 2017-01-30 DIAGNOSIS — G3184 Mild cognitive impairment, so stated: Secondary | ICD-10-CM | POA: Diagnosis not present

## 2017-01-30 DIAGNOSIS — M5137 Other intervertebral disc degeneration, lumbosacral region: Secondary | ICD-10-CM | POA: Diagnosis not present

## 2017-01-30 DIAGNOSIS — M545 Low back pain: Secondary | ICD-10-CM | POA: Diagnosis not present

## 2017-01-30 DIAGNOSIS — E039 Hypothyroidism, unspecified: Secondary | ICD-10-CM | POA: Diagnosis not present

## 2017-01-30 DIAGNOSIS — K5900 Constipation, unspecified: Secondary | ICD-10-CM | POA: Diagnosis not present

## 2017-01-30 DIAGNOSIS — M47897 Other spondylosis, lumbosacral region: Secondary | ICD-10-CM | POA: Diagnosis not present

## 2017-01-30 DIAGNOSIS — G8929 Other chronic pain: Secondary | ICD-10-CM | POA: Diagnosis not present

## 2017-01-30 DIAGNOSIS — R7989 Other specified abnormal findings of blood chemistry: Secondary | ICD-10-CM | POA: Diagnosis not present

## 2017-01-30 DIAGNOSIS — Z23 Encounter for immunization: Secondary | ICD-10-CM | POA: Diagnosis not present

## 2017-03-19 DIAGNOSIS — G2581 Restless legs syndrome: Secondary | ICD-10-CM | POA: Diagnosis not present

## 2017-03-19 DIAGNOSIS — M79661 Pain in right lower leg: Secondary | ICD-10-CM | POA: Diagnosis not present

## 2017-03-28 DIAGNOSIS — M5136 Other intervertebral disc degeneration, lumbar region: Secondary | ICD-10-CM | POA: Diagnosis not present

## 2017-03-28 DIAGNOSIS — M5416 Radiculopathy, lumbar region: Secondary | ICD-10-CM | POA: Diagnosis not present

## 2017-05-15 DIAGNOSIS — M5416 Radiculopathy, lumbar region: Secondary | ICD-10-CM | POA: Diagnosis not present

## 2017-05-15 DIAGNOSIS — M5136 Other intervertebral disc degeneration, lumbar region: Secondary | ICD-10-CM | POA: Diagnosis not present

## 2017-05-15 DIAGNOSIS — F1120 Opioid dependence, uncomplicated: Secondary | ICD-10-CM | POA: Diagnosis not present

## 2017-07-04 DIAGNOSIS — F329 Major depressive disorder, single episode, unspecified: Secondary | ICD-10-CM | POA: Diagnosis not present

## 2017-07-04 DIAGNOSIS — R41841 Cognitive communication deficit: Secondary | ICD-10-CM | POA: Diagnosis not present

## 2017-07-04 DIAGNOSIS — E038 Other specified hypothyroidism: Secondary | ICD-10-CM | POA: Diagnosis not present

## 2017-07-17 DIAGNOSIS — M5136 Other intervertebral disc degeneration, lumbar region: Secondary | ICD-10-CM | POA: Diagnosis not present

## 2017-07-17 DIAGNOSIS — M5416 Radiculopathy, lumbar region: Secondary | ICD-10-CM | POA: Diagnosis not present

## 2017-09-04 DIAGNOSIS — E039 Hypothyroidism, unspecified: Secondary | ICD-10-CM | POA: Diagnosis not present

## 2017-11-27 DIAGNOSIS — F1120 Opioid dependence, uncomplicated: Secondary | ICD-10-CM | POA: Insufficient documentation

## 2017-11-27 DIAGNOSIS — M5416 Radiculopathy, lumbar region: Secondary | ICD-10-CM | POA: Diagnosis not present

## 2017-11-27 DIAGNOSIS — M5136 Other intervertebral disc degeneration, lumbar region: Secondary | ICD-10-CM | POA: Diagnosis not present

## 2018-01-02 DIAGNOSIS — H9193 Unspecified hearing loss, bilateral: Secondary | ICD-10-CM | POA: Diagnosis not present

## 2018-01-02 DIAGNOSIS — R531 Weakness: Secondary | ICD-10-CM | POA: Diagnosis not present

## 2018-01-02 DIAGNOSIS — F320 Major depressive disorder, single episode, mild: Secondary | ICD-10-CM | POA: Diagnosis not present

## 2018-01-02 DIAGNOSIS — M5416 Radiculopathy, lumbar region: Secondary | ICD-10-CM | POA: Diagnosis not present

## 2018-01-02 DIAGNOSIS — E038 Other specified hypothyroidism: Secondary | ICD-10-CM | POA: Diagnosis not present

## 2018-01-02 DIAGNOSIS — R5383 Other fatigue: Secondary | ICD-10-CM | POA: Diagnosis not present

## 2018-01-02 DIAGNOSIS — M5136 Other intervertebral disc degeneration, lumbar region: Secondary | ICD-10-CM | POA: Diagnosis not present

## 2018-02-06 DIAGNOSIS — Z0000 Encounter for general adult medical examination without abnormal findings: Secondary | ICD-10-CM | POA: Diagnosis not present

## 2018-02-06 DIAGNOSIS — E038 Other specified hypothyroidism: Secondary | ICD-10-CM | POA: Diagnosis not present

## 2018-02-06 DIAGNOSIS — R413 Other amnesia: Secondary | ICD-10-CM | POA: Diagnosis not present

## 2018-02-06 DIAGNOSIS — M5136 Other intervertebral disc degeneration, lumbar region: Secondary | ICD-10-CM | POA: Diagnosis not present

## 2018-02-06 DIAGNOSIS — H9193 Unspecified hearing loss, bilateral: Secondary | ICD-10-CM | POA: Diagnosis not present

## 2018-02-06 DIAGNOSIS — F329 Major depressive disorder, single episode, unspecified: Secondary | ICD-10-CM | POA: Diagnosis not present

## 2018-02-28 DIAGNOSIS — M5416 Radiculopathy, lumbar region: Secondary | ICD-10-CM | POA: Diagnosis not present

## 2018-02-28 DIAGNOSIS — M5136 Other intervertebral disc degeneration, lumbar region: Secondary | ICD-10-CM | POA: Diagnosis not present

## 2018-03-21 DIAGNOSIS — H903 Sensorineural hearing loss, bilateral: Secondary | ICD-10-CM | POA: Diagnosis not present

## 2018-08-22 DIAGNOSIS — Z0000 Encounter for general adult medical examination without abnormal findings: Secondary | ICD-10-CM | POA: Diagnosis not present

## 2018-08-22 DIAGNOSIS — Z23 Encounter for immunization: Secondary | ICD-10-CM | POA: Diagnosis not present

## 2018-08-22 DIAGNOSIS — M5416 Radiculopathy, lumbar region: Secondary | ICD-10-CM | POA: Diagnosis not present

## 2018-08-22 DIAGNOSIS — E038 Other specified hypothyroidism: Secondary | ICD-10-CM | POA: Diagnosis not present

## 2018-08-22 DIAGNOSIS — Z79899 Other long term (current) drug therapy: Secondary | ICD-10-CM | POA: Diagnosis not present

## 2018-08-22 DIAGNOSIS — M5136 Other intervertebral disc degeneration, lumbar region: Secondary | ICD-10-CM | POA: Diagnosis not present

## 2018-08-22 DIAGNOSIS — R41841 Cognitive communication deficit: Secondary | ICD-10-CM | POA: Diagnosis not present

## 2018-08-22 DIAGNOSIS — F329 Major depressive disorder, single episode, unspecified: Secondary | ICD-10-CM | POA: Diagnosis not present

## 2018-08-27 DIAGNOSIS — M5416 Radiculopathy, lumbar region: Secondary | ICD-10-CM | POA: Diagnosis not present

## 2018-08-27 DIAGNOSIS — M5136 Other intervertebral disc degeneration, lumbar region: Secondary | ICD-10-CM | POA: Diagnosis not present

## 2018-09-11 DIAGNOSIS — F0390 Unspecified dementia without behavioral disturbance: Secondary | ICD-10-CM | POA: Diagnosis not present

## 2018-10-05 DIAGNOSIS — M545 Low back pain: Secondary | ICD-10-CM | POA: Diagnosis not present

## 2018-10-05 DIAGNOSIS — S22070A Wedge compression fracture of T9-T10 vertebra, initial encounter for closed fracture: Secondary | ICD-10-CM | POA: Diagnosis not present

## 2018-10-05 DIAGNOSIS — S199XXA Unspecified injury of neck, initial encounter: Secondary | ICD-10-CM | POA: Diagnosis not present

## 2018-10-05 DIAGNOSIS — S0990XA Unspecified injury of head, initial encounter: Secondary | ICD-10-CM | POA: Diagnosis not present

## 2018-10-05 DIAGNOSIS — S3992XA Unspecified injury of lower back, initial encounter: Secondary | ICD-10-CM | POA: Diagnosis not present

## 2018-10-05 DIAGNOSIS — S299XXA Unspecified injury of thorax, initial encounter: Secondary | ICD-10-CM | POA: Diagnosis not present

## 2018-10-05 DIAGNOSIS — W1839XA Other fall on same level, initial encounter: Secondary | ICD-10-CM | POA: Diagnosis not present

## 2018-10-05 DIAGNOSIS — Y998 Other external cause status: Secondary | ICD-10-CM | POA: Diagnosis not present

## 2018-10-06 DIAGNOSIS — S22070D Wedge compression fracture of T9-T10 vertebra, subsequent encounter for fracture with routine healing: Secondary | ICD-10-CM | POA: Diagnosis not present

## 2018-10-06 HISTORY — PX: FRACTURE SURGERY: SHX138

## 2018-10-09 DIAGNOSIS — S22070A Wedge compression fracture of T9-T10 vertebra, initial encounter for closed fracture: Secondary | ICD-10-CM | POA: Diagnosis not present

## 2018-10-09 DIAGNOSIS — M8008XA Age-related osteoporosis with current pathological fracture, vertebra(e), initial encounter for fracture: Secondary | ICD-10-CM | POA: Diagnosis not present

## 2018-10-16 DIAGNOSIS — S22070A Wedge compression fracture of T9-T10 vertebra, initial encounter for closed fracture: Secondary | ICD-10-CM | POA: Diagnosis not present

## 2018-10-16 DIAGNOSIS — D1809 Hemangioma of other sites: Secondary | ICD-10-CM | POA: Diagnosis not present

## 2018-10-16 DIAGNOSIS — M8008XA Age-related osteoporosis with current pathological fracture, vertebra(e), initial encounter for fracture: Secondary | ICD-10-CM | POA: Diagnosis not present

## 2018-10-31 ENCOUNTER — Emergency Department
Admission: EM | Admit: 2018-10-31 | Discharge: 2018-10-31 | Disposition: A | Discharge status: Home / Self Care | Payer: Medicare HMO | Source: Home / Self Care | Attending: Family Medicine | Admitting: Family Medicine

## 2018-10-31 ENCOUNTER — Encounter: Payer: Self-pay | Admitting: *Deleted

## 2018-10-31 ENCOUNTER — Other Ambulatory Visit: Payer: Self-pay

## 2018-10-31 DIAGNOSIS — M5136 Other intervertebral disc degeneration, lumbar region: Secondary | ICD-10-CM | POA: Diagnosis not present

## 2018-10-31 DIAGNOSIS — F1120 Opioid dependence, uncomplicated: Secondary | ICD-10-CM | POA: Diagnosis not present

## 2018-10-31 DIAGNOSIS — M5416 Radiculopathy, lumbar region: Secondary | ICD-10-CM | POA: Diagnosis not present

## 2018-10-31 DIAGNOSIS — R4182 Altered mental status, unspecified: Secondary | ICD-10-CM

## 2018-10-31 DIAGNOSIS — N3000 Acute cystitis without hematuria: Secondary | ICD-10-CM

## 2018-10-31 HISTORY — DX: Unspecified symptoms and signs involving cognitive functions and awareness: R41.9

## 2018-10-31 LAB — POCT URINALYSIS DIP (MANUAL ENTRY)
Bilirubin, UA: NEGATIVE
Blood, UA: NEGATIVE
Glucose, UA: NEGATIVE mg/dL
Nitrite, UA: NEGATIVE
Protein Ur, POC: 100 mg/dL — AB
Spec Grav, UA: 1.025 (ref 1.010–1.025)
Urobilinogen, UA: 2 E.U./dL — AB
pH, UA: 6 (ref 5.0–8.0)

## 2018-10-31 MED ORDER — CEPHALEXIN 500 MG PO CAPS: 500.0000 mg | ORAL_CAPSULE | Freq: Two times a day (BID) | ORAL | 0 refills | Status: DC

## 2018-10-31 NOTE — Discharge Instructions (Signed)
°  Please take your antibiotic as prescribed. A urine culture has been sent to check the severity of your urinary infection and to determine if you are on the most appropriate antibiotic. The results should come back within 2-3 days and you will be notified even if no medication change is needed.  Please stay well hydrated and follow up with your family doctor in 1 week if not improving, sooner if worsening.  

## 2018-10-31 NOTE — ED Triage Notes (Signed)
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 Recently she has seemed more confused and restless in the evening and night. Denies fever. Her PCP would like her checked for UTI.

## 2018-10-31 NOTE — ED Provider Notes (Signed)
[facility]    CSN: [id number] Arrival date & time: 10/31/18  1627     History   Chief Complaint Chief Complaint  Patient presents with  . Altered Mental Status    HPI Sandrea Campoy is a 78 y.o. female. Hx provided by daughter, pt recently dx with neurocognitive disorder.   HPI Jerlene Heiler is a 78 y.o. female presenting to UC with daughter with concern of insomnia and confusion that another family member called "sun downers."  Daughter called pt's PCP who recommended she be evaluated for a UTI.  Pt has been up frequently at night stating she needed to urinate but then would not urinate.  No fever, n/vd/. Pt denies abdominal pain or dysuria. Pt is being tx for a compression fracture in her spine from a recent fall.    Past Medical History:  Diagnosis Date  . Arthritis   . Back pain 08/15/2014  . DDD (degenerative disc disease) 03/26/2014  . H/O measles   . H/O mumps   . History of chicken pox   . History of tobacco use 03/26/2014  . Hypothyroid   . Insomnia 03/26/2014  . Neurocognitive disorder   . Preventative health care 03/26/2014  . Thyroid disease 01-01-14  . Upper respiratory infection 08/15/2014    Patient Active Problem List   Diagnosis Date Noted  . Elevated BP 08/15/2014  . Upper respiratory infection 08/15/2014  . Back pain 08/15/2014  . Anemia 03/26/2014  . 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 Other malaise and fatigue 03/26/2014  . Preventative health care 03/26/2014  . History of tobacco use 03/26/2014  . DDD (degenerative disc disease) 03/26/2014  . Insomnia 03/26/2014  . Hypothyroid   . Arthritis     Past Surgical History:  Procedure Laterality Date  . ABDOMINAL HYSTERECTOMY  78 yrs old   partial  . herniated disc  2005  . TONSILLECTOMY  51 yrs ago  . VARICOSE VEIN SURGERY Bilateral   . WISDOM TOOTH EXTRACTION      OB History   No obstetric history on file.      Home Medications    Prior to Admission medications     Medication Sig Start Date End Date Taking? Authorizing Provider  atorvastatin (LIPITOR) 10 MG tablet Take by mouth. 08/25/18  Yes [provider]  HYDROcodone-acetaminophen (NORCO/VICODIN) 5-325 MG tablet Take by mouth. 10/31/18 11/30/18 Yes [provider]  cephALEXin (KEFLEX) 500 MG capsule Take 1 capsule (500 mg total) by mouth 2 (two) times daily. 10/31/18   Noe Gens, PA-C  levothyroxine (SYNTHROID, LEVOTHROID) 112 MCG tablet Take 1 tablet (112 mcg total) by mouth daily. 08/10/14   Mosie Lukes, MD  traMADol Veatrice Bourbon) 50 MG tablet TAKE ONE TABLET BY MOUTH EVERY 6 HOURS AS NEEDED 10/07/14   Mosie Lukes, MD    Family History Family History  Problem Relation Age of Onset  . Cancer Father   . Cataracts Paternal Grandmother     Social History Social History   Tobacco Use  . Smoking status: Former Smoker    Packs/day: 1.50    Years: 53.00    Pack years: 79.50    Types: Cigarettes    Start date: 10/22/2010  Substance Use Topics  . Alcohol use: No  . Drug use: No     Allergies   Codeine   Review of Systems Review of Systems  Constitutional: Negative for chills and fever.  Gastrointestinal: Negative for diarrhea, nausea and vomiting.  Genitourinary: Positive  for frequency. Negative for dysuria and hematuria.  Musculoskeletal: Positive for back pain ( due to recent compression fracture).  Psychiatric/Behavioral: Positive for confusion.     Physical Exam Triage Vital Signs ED Triage Vitals  Enc Vitals Group     BP 10/31/18 1706 134/68     Pulse Rate 10/31/18 1706 (!) 110     Resp 10/31/18 1706 18     Temp 10/31/18 1706 98.3 F (36.8 C)     Temp Source 10/31/18 1706 Oral     SpO2 10/31/18 1706 97 %     Weight 10/31/18 1707 122 lb (55.3 kg)     Height 10/31/18 1707 5\' 3"  (1.6 m)     Head Circumference --      Peak Flow --      Pain Score 10/31/18 1707 0     Pain Loc --      Pain Edu? --      Excl. in [place]? --    No data found.  Updated  Vital Signs BP 134/68 (BP Location: Right Arm)   Pulse (!) 110   Temp 98.3 F (36.8 C) (Oral)   Resp 18   Ht 5\' 3"  (1.6 m)   Wt 122 lb (55.3 kg)   SpO2 97%   BMI 21.61 kg/m   Visual Acuity Right Eye Distance:   Left Eye Distance:   Bilateral Distance:    Right Eye Near:   Left Eye Near:    Bilateral Near:     Physical Exam Vitals signs and nursing note reviewed.  Constitutional:      Appearance: Normal appearance. She is well-developed.  HENT:     Head: Normocephalic and atraumatic.  Neck:     Musculoskeletal: Normal range of motion.  Cardiovascular:     Rate and Rhythm: Regular rhythm. Tachycardia present.  Pulmonary:     Effort: Pulmonary effort is normal.     Breath sounds: Normal breath sounds.  Abdominal:     Palpations: Abdomen is soft.     Tenderness: There is no abdominal tenderness.  Musculoskeletal: Normal range of motion.  Skin:    General: Skin is warm and dry.  Neurological:     Mental Status: She is alert and oriented to person, place, and time.  Psychiatric:        Behavior: Behavior normal.      UC Treatments / Results  Labs (all labs ordered are listed, but only abnormal results are displayed) Labs Reviewed  POCT URINALYSIS DIP (MANUAL ENTRY) - Abnormal; Notable for the following components:      Result Value   Clarity, UA cloudy (*)    Ketones, POC UA trace (5) (*)    Protein Ur, POC =100 (*)    Urobilinogen, UA 2.0 (*)    Leukocytes, UA Small (1+) (*)    All other components within normal limits  URINE CULTURE    EKG None  Radiology No results found.  Procedures Procedures (including critical care time)  Medications Ordered in UC Medications - No data to display  Initial Impression / Assessment and Plan / UC Course  I have reviewed the triage vital signs and the nursing notes.  Pertinent labs & imaging results that were available during my care of the patient were reviewed by me and considered in my medical decision  making (see chart for details).     UA: concerning for UTI Culture sent Will start on keflex while culture pending.  Final Clinical Impressions(s) / UC Diagnoses  Final diagnoses:  Altered mental status, unspecified altered mental status type  Acute cystitis without hematuria     Discharge Instructions      Please take your antibiotic as prescribed. A urine culture has been sent to check the severity of your urinary infection and to determine if you are on the most appropriate antibiotic. The results should come back within 2-3 days and you will be notified even if no medication change is needed.  Please stay well hydrated and follow up with your family doctor in 1 week if not improving, sooner if worsening.     ED Prescriptions    Medication Sig Dispense Auth. Provider   cephALEXin (KEFLEX) 500 MG capsule Take 1 capsule (500 mg total) by mouth 2 (two) times daily. 14 capsule Noe Gens, PA-C     Controlled Substance Prescriptions [place] Controlled Substance Registry consulted? Not Applicable   Tyrell Antonio 10/31/18 [id number]

## 2018-11-02 ENCOUNTER — Telehealth: Payer: Self-pay | Admitting: Emergency Medicine

## 2018-11-02 LAB — URINE CULTURE
MICRO NUMBER:: 40449
Result:: NO GROWTH
SPECIMEN QUALITY:: ADEQUATE

## 2018-11-02 NOTE — Telephone Encounter (Signed)
Attempted to contact patient. Left detailed message urine cx results negative. Advised to stop antibiotics and to call UC if additional questions or concerns needed.

## 2018-11-04 DIAGNOSIS — Z79891 Long term (current) use of opiate analgesic: Secondary | ICD-10-CM | POA: Diagnosis not present

## 2018-11-04 DIAGNOSIS — M545 Low back pain: Secondary | ICD-10-CM | POA: Diagnosis not present

## 2018-11-04 DIAGNOSIS — M47817 Spondylosis without myelopathy or radiculopathy, lumbosacral region: Secondary | ICD-10-CM | POA: Diagnosis not present

## 2018-11-04 DIAGNOSIS — Z79899 Other long term (current) drug therapy: Secondary | ICD-10-CM | POA: Diagnosis not present

## 2018-11-04 DIAGNOSIS — M8008XA Age-related osteoporosis with current pathological fracture, vertebra(e), initial encounter for fracture: Secondary | ICD-10-CM | POA: Diagnosis not present

## 2018-11-04 DIAGNOSIS — S22070A Wedge compression fracture of T9-T10 vertebra, initial encounter for closed fracture: Secondary | ICD-10-CM | POA: Diagnosis not present

## 2018-11-04 DIAGNOSIS — G894 Chronic pain syndrome: Secondary | ICD-10-CM | POA: Diagnosis not present

## 2018-11-12 DIAGNOSIS — G2571 Drug induced akathisia: Secondary | ICD-10-CM | POA: Diagnosis not present

## 2018-11-12 DIAGNOSIS — R419 Unspecified symptoms and signs involving cognitive functions and awareness: Secondary | ICD-10-CM | POA: Diagnosis not present

## 2018-11-18 DIAGNOSIS — D72829 Elevated white blood cell count, unspecified: Secondary | ICD-10-CM | POA: Diagnosis not present

## 2018-11-18 DIAGNOSIS — N390 Urinary tract infection, site not specified: Secondary | ICD-10-CM | POA: Diagnosis not present

## 2018-11-18 DIAGNOSIS — F99 Mental disorder, not otherwise specified: Secondary | ICD-10-CM | POA: Diagnosis not present

## 2018-11-18 DIAGNOSIS — Z9183 Wandering in diseases classified elsewhere: Secondary | ICD-10-CM | POA: Diagnosis not present

## 2018-11-18 DIAGNOSIS — F919 Conduct disorder, unspecified: Secondary | ICD-10-CM | POA: Diagnosis not present

## 2018-11-18 DIAGNOSIS — R197 Diarrhea, unspecified: Secondary | ICD-10-CM | POA: Diagnosis not present

## 2018-11-18 DIAGNOSIS — F0391 Unspecified dementia with behavioral disturbance: Secondary | ICD-10-CM | POA: Diagnosis not present

## 2018-11-18 DIAGNOSIS — I4581 Long QT syndrome: Secondary | ICD-10-CM | POA: Diagnosis not present

## 2018-11-18 DIAGNOSIS — Z87891 Personal history of nicotine dependence: Secondary | ICD-10-CM | POA: Diagnosis not present

## 2018-11-18 DIAGNOSIS — R9431 Abnormal electrocardiogram [ECG] [EKG]: Secondary | ICD-10-CM | POA: Diagnosis not present

## 2018-11-18 DIAGNOSIS — G309 Alzheimer's disease, unspecified: Secondary | ICD-10-CM | POA: Diagnosis not present

## 2018-11-18 DIAGNOSIS — R9082 White matter disease, unspecified: Secondary | ICD-10-CM | POA: Diagnosis not present

## 2018-11-18 DIAGNOSIS — E785 Hyperlipidemia, unspecified: Secondary | ICD-10-CM | POA: Diagnosis not present

## 2018-11-18 DIAGNOSIS — R8271 Bacteriuria: Secondary | ICD-10-CM | POA: Diagnosis not present

## 2018-11-18 DIAGNOSIS — Z79899 Other long term (current) drug therapy: Secondary | ICD-10-CM | POA: Diagnosis not present

## 2018-11-18 DIAGNOSIS — F05 Delirium due to known physiological condition: Secondary | ICD-10-CM | POA: Diagnosis not present

## 2018-11-18 DIAGNOSIS — R4689 Other symptoms and signs involving appearance and behavior: Secondary | ICD-10-CM | POA: Diagnosis not present

## 2018-11-18 DIAGNOSIS — F0281 Dementia in other diseases classified elsewhere with behavioral disturbance: Secondary | ICD-10-CM | POA: Diagnosis not present

## 2018-11-18 DIAGNOSIS — F03918 Unspecified dementia, unspecified severity, with other behavioral disturbance: Secondary | ICD-10-CM | POA: Insufficient documentation

## 2018-11-18 DIAGNOSIS — Z888 Allergy status to other drugs, medicaments and biological substances status: Secondary | ICD-10-CM | POA: Diagnosis not present

## 2018-11-18 DIAGNOSIS — M1990 Unspecified osteoarthritis, unspecified site: Secondary | ICD-10-CM | POA: Diagnosis not present

## 2018-11-18 DIAGNOSIS — E039 Hypothyroidism, unspecified: Secondary | ICD-10-CM | POA: Diagnosis not present

## 2018-11-22 DIAGNOSIS — R9431 Abnormal electrocardiogram [ECG] [EKG]: Secondary | ICD-10-CM | POA: Insufficient documentation

## 2018-11-22 DIAGNOSIS — R8271 Bacteriuria: Secondary | ICD-10-CM | POA: Insufficient documentation

## 2018-11-27 DIAGNOSIS — G479 Sleep disorder, unspecified: Secondary | ICD-10-CM | POA: Diagnosis not present

## 2018-11-27 DIAGNOSIS — R419 Unspecified symptoms and signs involving cognitive functions and awareness: Secondary | ICD-10-CM | POA: Diagnosis not present

## 2018-12-09 DIAGNOSIS — M5116 Intervertebral disc disorders with radiculopathy, lumbar region: Secondary | ICD-10-CM | POA: Diagnosis not present

## 2018-12-09 DIAGNOSIS — M48061 Spinal stenosis, lumbar region without neurogenic claudication: Secondary | ICD-10-CM | POA: Diagnosis not present

## 2018-12-09 DIAGNOSIS — M4807 Spinal stenosis, lumbosacral region: Secondary | ICD-10-CM | POA: Diagnosis not present

## 2018-12-09 DIAGNOSIS — M47817 Spondylosis without myelopathy or radiculopathy, lumbosacral region: Secondary | ICD-10-CM | POA: Diagnosis not present

## 2018-12-09 DIAGNOSIS — M5117 Intervertebral disc disorders with radiculopathy, lumbosacral region: Secondary | ICD-10-CM | POA: Diagnosis not present

## 2018-12-25 DIAGNOSIS — M5136 Other intervertebral disc degeneration, lumbar region: Secondary | ICD-10-CM | POA: Diagnosis not present

## 2018-12-25 DIAGNOSIS — M47817 Spondylosis without myelopathy or radiculopathy, lumbosacral region: Secondary | ICD-10-CM | POA: Diagnosis not present

## 2019-01-05 DIAGNOSIS — M47816 Spondylosis without myelopathy or radiculopathy, lumbar region: Secondary | ICD-10-CM | POA: Diagnosis not present

## 2019-01-07 ENCOUNTER — Ambulatory Visit (INDEPENDENT_AMBULATORY_CARE_PROVIDER_SITE_OTHER): Payer: Medicare HMO | Admitting: Physician Assistant

## 2019-01-07 ENCOUNTER — Encounter: Payer: Self-pay | Admitting: Physician Assistant

## 2019-01-07 ENCOUNTER — Other Ambulatory Visit: Payer: Self-pay

## 2019-01-07 VITALS — BP 117/67 | HR 97 | Temp 98.1°F | Resp 14 | Wt 127.0 lb

## 2019-01-07 DIAGNOSIS — S22070S Wedge compression fracture of T9-T10 vertebra, sequela: Secondary | ICD-10-CM

## 2019-01-07 DIAGNOSIS — D519 Vitamin B12 deficiency anemia, unspecified: Secondary | ICD-10-CM | POA: Diagnosis not present

## 2019-01-07 DIAGNOSIS — E038 Other specified hypothyroidism: Secondary | ICD-10-CM

## 2019-01-07 DIAGNOSIS — R197 Diarrhea, unspecified: Secondary | ICD-10-CM | POA: Diagnosis not present

## 2019-01-07 DIAGNOSIS — Z1231 Encounter for screening mammogram for malignant neoplasm of breast: Secondary | ICD-10-CM | POA: Diagnosis not present

## 2019-01-07 DIAGNOSIS — Z780 Asymptomatic menopausal state: Secondary | ICD-10-CM

## 2019-01-07 DIAGNOSIS — Z7689 Persons encountering health services in other specified circumstances: Secondary | ICD-10-CM | POA: Diagnosis not present

## 2019-01-07 DIAGNOSIS — S22070A Wedge compression fracture of T9-T10 vertebra, initial encounter for closed fracture: Secondary | ICD-10-CM | POA: Insufficient documentation

## 2019-01-07 DIAGNOSIS — E782 Mixed hyperlipidemia: Secondary | ICD-10-CM | POA: Diagnosis not present

## 2019-01-07 DIAGNOSIS — F0391 Unspecified dementia with behavioral disturbance: Secondary | ICD-10-CM | POA: Diagnosis not present

## 2019-01-07 DIAGNOSIS — G4701 Insomnia due to medical condition: Secondary | ICD-10-CM

## 2019-01-07 MED ORDER — ATORVASTATIN CALCIUM 10 MG PO TABS: 10.0000 mg | ORAL_TABLET | Freq: Every day | ORAL | 1 refills | Status: DC

## 2019-01-07 MED ORDER — LEVOTHYROXINE SODIUM 50 MCG PO TABS: 50.0000 ug | ORAL_TABLET | Freq: Every day | ORAL | 1 refills | Status: DC

## 2019-01-07 NOTE — Progress Notes (Signed)
HPI:                                                                Katherine Griffith is a 78 y.o. female who presents to [place]: [place] today to establish care  Current concerns:   History is provided by patient and sitter, Sharyn Lull.  Hypothyroidism: takes synthroid 50 mcg daily without issues. Denies chest pain, heart palpitations, tremor, or skin changes. Requesting refill today. Last TSH 3.36, 11/22/2018 ([facility], Care Everywhere)   Diarrhea: watery diarrhea for a couple of weeks multiple times per day. Sitter reports that it is gradually improving. Initially she reports she was going every hour, now reports she is going approximately 2-4 times per day. Denies fever, chills, malaise, appetite change, nausea/vomiting No recent antibiotic use No recent travel No recent hospitalizations, last hospitalization 11/19/18 in [place] for dementia Lives at home with daughter and has a Actuary during the day.   Past Medical History:  Diagnosis Date  . Arthritis   . Back pain 08/15/2014  . DDD (degenerative disc disease) 03/26/2014  . H/O measles   . H/O mumps   . History of chicken pox   . History of tobacco use 03/26/2014  . Hyperlipidemia   . Hypothyroid   . Insomnia 03/26/2014  . Neurocognitive disorder   . Thyroid disease 01-01-14   Past Surgical History:  Procedure Laterality Date  . ABDOMINAL HYSTERECTOMY  78 yrs old   partial  . FRACTURE SURGERY    . herniated disc  2005  . TONSILLECTOMY  51 yrs ago  . VARICOSE VEIN SURGERY Bilateral   . WISDOM TOOTH EXTRACTION     Social History   Tobacco Use  . Smoking status: Former Smoker    Packs/day: 1.50    Years: 53.00    Pack years: 79.50    Types: Cigarettes    Start date: 10/22/2010    Last attempt to quit: 11/14/2017    Years since quitting: 1.1  . Smokeless tobacco: Never Used  Substance Use Topics  . Alcohol use: No   family history includes Cancer in her father; Cataracts  in her paternal grandmother.    ROS: negative except as noted in the HPI  Medications: Current Outpatient Medications  Medication Sig Dispense Refill  . atorvastatin (LIPITOR) 10 MG tablet Take 1 tablet (10 mg total) by mouth at bedtime. 90 tablet 1  . levothyroxine (SYNTHROID, LEVOTHROID) 50 MCG tablet Take 1 tablet (50 mcg total) by mouth daily before breakfast. 90 tablet 1  . traMADol (ULTRAM) 50 MG tablet TAKE ONE TABLET BY MOUTH EVERY 6 HOURS AS NEEDED 120 tablet 0  . OLANZapine (ZYPREXA) 2.5 MG tablet Take 2.5 mg by mouth at bedtime.    . traZODone (DESYREL) 100 MG tablet Take 100 mg by mouth at bedtime.     No current facility-administered medications for this visit.    Allergies  Allergen Reactions  . Codeine Nausea Only       Objective:  BP 117/67   Pulse 97   Temp 98.1 F (36.7 C)   Resp 14   Wt 127 lb (57.6 kg)   BMI 22.50 kg/m  Gen:  alert, not ill-appearing, no distress, appropriate for age 75: head normocephalic without  obvious abnormality, conjunctiva and cornea clear, wearing glasses, trachea midline Pulm: Normal work of breathing, normal phonation, clear to auscultation bilaterally, no wheezes, rales or rhonchi CV: Normal rate, regular rhythm, s1 and s2 distinct, no murmurs, clicks or rubs  GI: abdomen soft, non-tender, non-distended MSK: extremities atraumatic Skin: intact, no rashes on exposed skin, no jaundice, no cyanosis  Lab Results  Component Value Date   CREATININE 0.8 08/10/2014   BUN 18 08/10/2014   NA 141 08/10/2014   K 3.6 08/10/2014   CL 103 08/10/2014   CO2 27 08/10/2014   Labs Care Everywhere 11/22/2018 WBC 8.9 RBC 3.65 Hgb 11.4 MCV 100  Vit D 25 4.7 B12 158   TSH 3.36  Glucose 108 K 3.8 Scr 0.52 GFR 92  Hgb A1C 5.2   TC 168 Trig 87 LDL 105  MR SPINE LUMBAR WO CONTRAST2/19/2020 [facility] Result Narrative  CLINICAL DATA: Fall from bed December 2019. Back pain  without radiculopathy  EXAM: MRI LUMBAR SPINE WITHOUT CONTRAST  TECHNIQUE: Multiplanar, multisequence MR imaging of the lumbar spine was performed. No intravenous contrast was administered.  COMPARISON: None similar  FINDINGS: Segmentation: 5 lumbar type vertebrae.  Alignment: Normal  Vertebrae: No fracture, evidence of discitis, or aggressive bone lesion. Sizable hemangiomas in the T12, L1, L3, and S2 vertebrae.  Conus medullaris and cauda equina: Conus extends to the L1-2 level. Conus and cauda equina appear normal.  Paraspinal and other soft tissues: Negative  Disc levels:  T12- L1: Unremarkable.  L1-L2: Tiny central protrusion without neural contact  L2-L3: Tiny central protrusion without neural contact  L3-L4: Mild annulus bulging. No impingement  L4-L5: Mild disc and narrowing and annulus bulging. Facet spurring. No impingement  L5-S1:Greatest level of degenerative disc narrowing. There has likely been a prior left-sided laminotomy. Disc height loss and endplate ridging causes mild to moderate left foraminal stenosis. Widely patent canal  IMPRESSION: 1. No fracture or other acute finding related to fall. 2. Degeneration greatest at L5-S1 where there is mild to moderate left foraminal narrowing.   Electronically Signed  By: Monte Fantasia M.D.  On: 12/10/2018 06:24      Assessment and Plan: 78 y.o. female with   .Leba was seen today for establish care.  Diagnoses and all orders for this visit:  Encounter to establish care  Anemia due to vitamin B12 deficiency, unspecified B12 deficiency type  Diarrhea, unspecified type -     Clostridium difficile Toxin B, Qualitative, Real-Time PCR -     Stool culture -     Ova and parasite examination  Other specified hypothyroidism -     levothyroxine (SYNTHROID, LEVOTHROID) 50 MCG tablet; Take 1 tablet (50 mcg total) by mouth daily before breakfast.  Insomnia due to medical  condition  Dementia with behavioral disturbance, unspecified dementia type (HCC)  Mixed hyperlipidemia -     atorvastatin (LIPITOR) 10 MG tablet; Take 1 tablet (10 mg total) by mouth at bedtime.  Compression fracture of T9 vertebra, sequela -     DG Bone Density; Future  Breast cancer screening by mammogram -     MM 3D SCREEN BREAST BILATERAL; Future  Postmenopausal estrogen deficiency -     DG Bone Density; Future     - Personally reviewed PMH, PSH, PFH, medications, allergies, HM - Personally reviewed lab results in [place] (see above). - Age-appropriate cancer screening: last mammogram 01/23/2017; order placed today - Overdue for bone density, order placed - Influenza declined - Tdap UTD -  Pneumococcal vaccines UTD   Diarrhea - C. Diff and stool culture pending - instructed to hold Atorvastatin until diarrhea improves - instructed to avoid anti-diarrheals until stool studies results  B12 deficiency anemia Hgb 11.4, B12 158 1000 mcg IM given in office today Repeat B12 level and CBC in 2 weeks  Dementia Stable Keep follow-up with neuro Cont current medications  Patient education and anticipatory guidance given Patient agrees with treatment plan Follow-up in 2 weeks or sooner as needed if symptoms worsen or fail to improve  Darlyne Russian PA-C

## 2019-01-07 NOTE — Patient Instructions (Signed)
Hold Atorvastatin (cholesterol medication) until diarrhea has resolved No more anti-diarrheal medication until stool studies have resulted Follow a bland, low-fiber diet until diarrhea resolves   Food Choices to Help Relieve Diarrhea, Adult When you have diarrhea, the foods you eat and your eating habits are very important. Choosing the right foods and drinks can help:  Relieve diarrhea.  Replace lost fluids and nutrients.  Prevent dehydration. What general guidelines should I follow?  Relieving diarrhea  Choose foods with less than 2 g or .07 oz. of fiber per serving.  Limit fats to less than 8 tsp (38 g or 1.34 oz.) a day.  Avoid the following: ? Foods and beverages sweetened with high-fructose corn syrup, honey, or sugar alcohols such as xylitol, sorbitol, and mannitol. ? Foods that contain a lot of fat or sugar. ? Fried, greasy, or spicy foods. ? High-fiber grains, breads, and cereals. ? Raw fruits and vegetables.  Eat foods that are rich in probiotics. These foods include dairy products such as yogurt and fermented milk products. They help increase healthy bacteria in the stomach and intestines (gastrointestinal tract, or GI tract).  If you have lactose intolerance, avoid dairy products. These may make your diarrhea worse.  Take medicine to help stop diarrhea (antidiarrheal medicine) only as told by your health care provider. Replacing nutrients  Eat small meals or snacks every 3-4 hours.  Eat bland foods, such as white rice, toast, or baked potato, until your diarrhea starts to get better. Gradually reintroduce nutrient-rich foods as tolerated or as told by your health care provider. This includes: ? Well-cooked protein foods. ? Peeled, seeded, and soft-cooked fruits and vegetables. ? Low-fat dairy products.  Take vitamin and mineral supplements as told by your health care provider. Preventing dehydration  Start by sipping water or a special solution to prevent  dehydration (oral rehydration solution, ORS). Urine that is clear or pale yellow means that you are getting enough fluid.  Try to drink at least 8-10 cups of fluid each day to help replace lost fluids.  You may add other liquids in addition to water, such as clear juice or decaffeinated sports drinks, as tolerated or as told by your health care provider.  Avoid drinks with caffeine, such as coffee, tea, or soft drinks.  Avoid alcohol. What foods are recommended?     The items listed may not be a complete list. Talk with your health care provider about what dietary choices are best for you. Grains White rice. White, [place], or pita breads (fresh or toasted), including plain rolls, buns, or bagels. White pasta. Saltine, soda, or graham crackers. Pretzels. Low-fiber cereal. Cooked cereals made with water (such as cornmeal, farina, or cream cereals). Plain muffins. Matzo. Melba toast. Zwieback. Vegetables Potatoes (without the skin). Most well-cooked and canned vegetables without skins or seeds. Tender lettuce. Fruits Apple sauce. Fruits canned in juice. Cooked apricots, cherries, grapefruit, peaches, pears, or plums. Fresh bananas and cantaloupe. Meats and other protein foods Baked or boiled chicken. Eggs. Tofu. Fish. Seafood. Smooth nut butters. Ground or well-cooked tender beef, ham, veal, lamb, pork, or poultry. Dairy Plain yogurt, kefir, and unsweetened liquid yogurt. Lactose-free milk, buttermilk, skim milk, or soy milk. Low-fat or nonfat hard cheese. Beverages Water. Low-calorie sports drinks. Fruit juices without pulp. Strained tomato and vegetable juices. Decaffeinated teas. Sugar-free beverages not sweetened with sugar alcohols. Oral rehydration solutions, if approved by your health care provider. Seasoning and other foods Bouillon, broth, or soups made from recommended foods. What foods are  not recommended? The items listed may not be a complete list. Talk with your health care  provider about what dietary choices are best for you. Grains Whole grain, whole wheat, bran, or rye breads, rolls, pastas, and crackers. Wild or brown rice. Whole grain or bran cereals. Barley. Oats and oatmeal. Corn tortillas or taco shells. Granola. Popcorn. Vegetables Raw vegetables. Fried vegetables. Cabbage, broccoli, Brussels sprouts, artichokes, baked beans, beet greens, corn, kale, legumes, peas, sweet potatoes, and yams. Potato skins. Cooked spinach and cabbage. Fruits Dried fruit, including raisins and dates. Raw fruits. Stewed or dried prunes. Canned fruits with syrup. Meat and other protein foods Fried or fatty meats. Deli meats. Chunky nut butters. Nuts and seeds. Beans and lentils. Berniece Salines. Hot dogs. Sausage. Dairy High-fat cheeses. Whole milk, chocolate milk, and beverages made with milk, such as milk shakes. Half-and-half. Cream. sour cream. Ice cream. Beverages Caffeinated beverages (such as coffee, tea, soda, or energy drinks). Alcoholic beverages. Fruit juices with pulp. Prune juice. Soft drinks sweetened with high-fructose corn syrup or sugar alcohols. High-calorie sports drinks. Fats and oils Butter. Cream sauces. Margarine. Salad oils. Plain salad dressings. Olives. Avocados. Mayonnaise. Sweets and desserts Sweet rolls, doughnuts, and sweet breads. Sugar-free desserts sweetened with sugar alcohols such as xylitol and sorbitol. Seasoning and other foods Honey. Hot sauce. Chili powder. Gravy. Cream-based or milk-based soups. Pancakes and waffles. Summary  When you have diarrhea, the foods you eat and your eating habits are very important.  Make sure you get at least 8-10 cups of fluid each day, or enough to keep your urine clear or pale yellow.  Eat bland foods and gradually reintroduce healthy, nutrient-rich foods as tolerated, or as told by your health care provider.  Avoid high-fiber, fried, greasy, or spicy foods. This information is not intended to replace advice  given to you by your health care provider. Make sure you discuss any questions you have with your health care provider. Document Released: 12/29/2003 Document Revised: 10/05/2016 Document Reviewed: 10/05/2016 Elsevier Interactive Patient Education  2019 [place], Vitamin B12 injection What is this medicine? CYANOCOBALAMIN (sye an oh koe BAL a min) is a man made form of vitamin B12. Vitamin B12 is used in the growth of healthy blood cells, nerve cells, and proteins in the body. It also helps with the metabolism of fats and carbohydrates. This medicine is used to treat people who can not absorb vitamin B12. This medicine may be used for other purposes; ask your health care provider or pharmacist if you have questions. COMMON BRAND NAME(S): B-12 Compliance Kit, B-12 Injection Kit, Cyomin, LA-12, Nutri-Twelve, Physicians EZ Use B-12, Primabalt What should I tell my health care provider before I take this medicine? They need to know if you have any of these conditions: -kidney disease -Leber's disease -megaloblastic anemia -an unusual or allergic reaction to cyanocobalamin, cobalt, other medicines, foods, dyes, or preservatives -pregnant or trying to get pregnant -breast-feeding How should I use this medicine? This medicine is injected into a muscle or deeply under the skin. It is usually given by a health care professional in a clinic or doctor's office. However, your doctor may teach you how to inject yourself. Follow all instructions. Talk to your pediatrician regarding the use of this medicine in children. Special care may be needed. Overdosage: If you think you have taken too much of this medicine contact a poison control center or emergency room at once. NOTE: This medicine is only for you. Do not share this medicine with  others. What if I miss a dose? If you are given your dose at a clinic or doctor's office, call to reschedule your appointment. If you give your own  injections and you miss a dose, take it as soon as you can. If it is almost time for your next dose, take only that dose. Do not take double or extra doses. What may interact with this medicine? -colchicine -heavy alcohol intake This list may not describe all possible interactions. Give your health care provider a list of all the medicines, herbs, non-prescription drugs, or dietary supplements you use. Also tell them if you smoke, drink alcohol, or use illegal drugs. Some items may interact with your medicine. What should I watch for while using this medicine? Visit your doctor or health care professional regularly. You may need blood work done while you are taking this medicine. You may need to follow a special diet. Talk to your doctor. Limit your alcohol intake and avoid smoking to get the best benefit. What side effects may I notice from receiving this medicine? Side effects that you should report to your doctor or health care professional as soon as possible: -allergic reactions like skin rash, itching or hives, swelling of the face, lips, or tongue -blue tint to skin -chest tightness, pain -difficulty breathing, wheezing -dizziness -red, swollen painful area on the leg Side effects that usually do not require medical attention (report to your doctor or health care professional if they continue or are bothersome): -diarrhea -headache This list may not describe all possible side effects. Call your doctor for medical advice about side effects. You may report side effects to FDA at 1-800-FDA-1088. Where should I keep my medicine? Keep out of the reach of children. Store at room temperature between 15 and 30 degrees C (59 and 85 degrees F). Protect from light. Throw away any unused medicine after the expiration date. NOTE: This sheet is a summary. It may not cover all possible information. If you have questions about this medicine, talk to your doctor, pharmacist, or health care provider.   2019 Elsevier/Gold Standard (2008-01-19 22:10:20)

## 2019-01-12 LAB — STOOL CULTURE
MICRO NUMBER:: 334200
MICRO NUMBER:: 334201
MICRO NUMBER:: 334202
SHIGA RESULT:: NOT DETECTED
SPECIMEN QUALITY: ADEQUATE
SPECIMEN QUALITY:: ADEQUATE
SPECIMEN QUALITY:: ADEQUATE

## 2019-01-12 LAB — OVA AND PARASITE EXAMINATION

## 2019-01-12 LAB — CLOSTRIDIUM DIFFICILE TOXIN B, QUALITATIVE, REAL-TIME PCR: Toxigenic C. Difficile by PCR: NOT DETECTED

## 2019-01-18 DIAGNOSIS — E782 Mixed hyperlipidemia: Secondary | ICD-10-CM | POA: Insufficient documentation

## 2019-01-19 ENCOUNTER — Telehealth: Payer: Self-pay

## 2019-01-19 DIAGNOSIS — D519 Vitamin B12 deficiency anemia, unspecified: Secondary | ICD-10-CM

## 2019-01-19 MED ORDER — CYANOCOBALAMIN 1000 MCG/ML IJ SOLN
1000.0000 ug | Freq: Once | INTRAMUSCULAR | Status: AC
  Administered 2019-01-07: 1000 ug via INTRAMUSCULAR

## 2019-01-19 NOTE — Telephone Encounter (Signed)
She has never had stomach/intestinal surgery. Labs ordered and patient's daughter advised.

## 2019-01-19 NOTE — Telephone Encounter (Signed)
Siniya's daughter advised and labs ordered.

## 2019-01-19 NOTE — Telephone Encounter (Signed)
Planning to recheck her level in 1 week She may not need injections and depending on her level can be switched to PO B12. I will just need to see her level to determine this Can you ask daughter if she has any history of stomach/intestinal surgeries (such as gastric bypass)

## 2019-01-19 NOTE — Telephone Encounter (Signed)
Katherine Griffith's daughter called to see if the B12 injectable medication can be sent to the pharmacy. She states she can give the injection to her mom. She states she is able to give injection. Please advise.

## 2019-01-19 NOTE — Addendum Note (Signed)
Addended by: Delrae Alfred on: 01/19/2019 08:32 AM   Modules accepted: Orders

## 2019-01-21 ENCOUNTER — Ambulatory Visit: Payer: Medicare HMO | Admitting: Physician Assistant

## 2019-01-22 DIAGNOSIS — D519 Vitamin B12 deficiency anemia, unspecified: Secondary | ICD-10-CM | POA: Diagnosis not present

## 2019-01-23 LAB — CBC WITH DIFFERENTIAL/PLATELET
Absolute Monocytes: 503 cells/uL (ref 200–950)
Basophils Absolute: 89 cells/uL (ref 0–200)
Basophils Relative: 1.2 %
Eosinophils Absolute: 444 cells/uL (ref 15–500)
Eosinophils Relative: 6 %
HCT: 36.4 % (ref 35.0–45.0)
Hemoglobin: 11.9 g/dL (ref 11.7–15.5)
Lymphs Abs: 2183 cells/uL (ref 850–3900)
MCH: 30.3 pg (ref 27.0–33.0)
MCHC: 32.7 g/dL (ref 32.0–36.0)
MCV: 92.6 fL (ref 80.0–100.0)
MPV: 11.4 fL (ref 7.5–12.5)
Monocytes Relative: 6.8 %
Neutro Abs: 4181 cells/uL (ref 1500–7800)
Neutrophils Relative %: 56.5 %
Platelets: 269 10*3/uL (ref 140–400)
RBC: 3.93 10*6/uL (ref 3.80–5.10)
RDW: 12.7 % (ref 11.0–15.0)
Total Lymphocyte: 29.5 %
WBC: 7.4 10*3/uL (ref 3.8–10.8)

## 2019-01-23 LAB — VITAMIN B12: Vitamin B-12: 351 pg/mL (ref 200–1100)

## 2019-01-26 ENCOUNTER — Telehealth: Payer: Self-pay | Admitting: Physician Assistant

## 2019-01-26 DIAGNOSIS — E538 Deficiency of other specified B group vitamins: Secondary | ICD-10-CM

## 2019-01-26 MED ORDER — B-12 500 MCG SL SUBL: 500.0000 ug | SUBLINGUAL_TABLET | Freq: Every day | SUBLINGUAL | 2 refills | Status: DC

## 2019-01-26 NOTE — Telephone Encounter (Signed)
Patient's daughter advised.  

## 2019-01-26 NOTE — Telephone Encounter (Signed)
Rx sent. This is also available OTC

## 2019-02-13 DIAGNOSIS — M47817 Spondylosis without myelopathy or radiculopathy, lumbosacral region: Secondary | ICD-10-CM | POA: Diagnosis not present

## 2019-02-13 DIAGNOSIS — M545 Low back pain: Secondary | ICD-10-CM | POA: Diagnosis not present

## 2019-03-10 ENCOUNTER — Telehealth (INDEPENDENT_AMBULATORY_CARE_PROVIDER_SITE_OTHER): Payer: Medicare HMO

## 2019-03-10 DIAGNOSIS — K591 Functional diarrhea: Secondary | ICD-10-CM | POA: Diagnosis not present

## 2019-03-10 MED ORDER — RIFAXIMIN 550 MG PO TABS: 550.0000 mg | ORAL_TABLET | Freq: Three times a day (TID) | ORAL | 0 refills | Status: AC

## 2019-03-10 NOTE — Telephone Encounter (Signed)
Called and spoke to patient's daughter and healthcare POA, Venetia Night. Verified patient's name and DOB  HPI Daughter states this has been a chronic problem for the last 8 months that is waxing and waning  For the last 10 days diarrhea has been worsening 2-3 episodes per day of brown watery stools that is foul smelling No undigested food She has fecal urgency No change in appetite, fever, weight loss, abdominal pain, hematochezia/melena Seems to be triggered by certain foods such as beans Daughter states she is tolerating PO diet and fluids. Urinating normally She tried Imodium up to 4 times per day for several weeks without any improvement Daughter has also tried probiotics and fiber supplementation  Patient has never had colon cancer screening. No personal hx of colitis, IBD, or IBS  Objective Patient was evaluated in our office 2 months ago and C.diff and stool culture were negative  Labs in Care Everywhere 11/20/2018 reviewed by me Normal renal and liver function Mild anemia and B12 deficiency   Assessment and Plan  .Cornelius was seen today for diarrhea.  Diagnoses and all orders for this visit:  Functional diarrhea -     rifaximin (XIFAXAN) 550 MG TABS tablet; Take 1 tablet (550 mg total) by mouth 3 (three) times daily for 14 days. -     Ambulatory referral to Gastroenterology   Given that C.diff/infection have been ruled out, the fact that symptoms wax and wane and have an association with certain foods, and there have been no red flag symptoms such as weight loss or blood in stool suspect this is a functional diarrhea such as IBS-D Will try Xifaxan tid x 14 days Counseled on IBS eating plan and suggested foods like rice, apple sauce, bananas Stop fiber supplements Okay to continue probiotic, but d/c once she starts Xifaxan Recommend avoiding antispasmodics and Lomotil due to patient's underlying dementia and psychiatric medications Referral to GI for further  recommendations Recommended daughter bring patient in to the office if she stops tolerating PO or if there is any concern for dehydration   Darlyne Russian PA-C

## 2019-03-10 NOTE — Telephone Encounter (Signed)
Venetia Night called and states Katherine Griffith is still having about 3 episodes of diarrhea daily. She is taking the Imodium as directed. She would like to know the next step. Please advise.

## 2019-03-11 ENCOUNTER — Telehealth (INDEPENDENT_AMBULATORY_CARE_PROVIDER_SITE_OTHER): Payer: Medicare HMO | Admitting: Gastroenterology

## 2019-03-11 ENCOUNTER — Other Ambulatory Visit: Payer: Self-pay

## 2019-03-11 ENCOUNTER — Encounter: Payer: Self-pay | Admitting: Gastroenterology

## 2019-03-11 VITALS — Ht 61.0 in | Wt 123.0 lb

## 2019-03-11 DIAGNOSIS — R197 Diarrhea, unspecified: Secondary | ICD-10-CM | POA: Diagnosis not present

## 2019-03-11 DIAGNOSIS — F0391 Unspecified dementia with behavioral disturbance: Secondary | ICD-10-CM

## 2019-03-11 MED ORDER — DIPHENOXYLATE-ATROPINE 2.5-0.025 MG PO TABS: 1.0000 | ORAL_TABLET | Freq: Four times a day (QID) | ORAL | 1 refills | Status: DC | PRN

## 2019-03-11 NOTE — Telephone Encounter (Signed)
Message from Plan ([place])  PA Case: [id number], Status: Approved, Coverage Starts on: 03/11/2019 12:00:00 AM, Coverage Ends on: 06/09/2019 12:00:00 AM. Questions? Contact [phone]. Pharmacy aware.  The price is over $600. I see that she is seeing GI today, do we need to do anything else? Please advise.

## 2019-03-11 NOTE — Progress Notes (Signed)
Chief Complaint: Diarrhea   Referring Provider:     Trixie Dredge, PA-C   HPI:    Due to current restrictions/limitations of in-office visits due to the COVID-19 pandemic, this scheduled clinical appointment was converted to a telehealth consultation via telephone.  -Time of medical discussion: 30 minutes -The patient did consent to this telephone visit and is aware of possible charges through their insurance for this visit.  -Names of all parties present: Katherine Griffith (patient), Milagros Reap (daughter; primary caretaker), Gerrit Heck, DO, [facility] (physician)  Katherine Griffith is a 78 y.o. female with a history of dementia referred to the Gastroenterology Clinic for evaluation of diarrhea.  She was seen by her new PCM, [name], PA-C, to establish care on 01/07/2019 and endorsed watery, nonbloody diarrhea multiple times per day for a few weeks.  No preceding change in medications, antibiotics, travel.  Hospitalized to [facility] in 10/2018 for dementia.  Lives at home with daughter and has a Actuary during the day. Daughter supplies hx.   Daughter states diarrhea has been present for months, with exacerbation in 11/2018 after hospitalization in Jan. Diarrhea had resolved for 4-5 weeks, then recurred in the last week or so and she is concerned about dehydration. Having 3 BM/day. No hematochezia. No longer taking fiber, but did try fiber earlier in the course without improvement. Has kept diet diary and no foods associated with symptom exacerbation.  No abdominal pain.  No upper GI symptoms.  C. difficile, stool culture, O&P negative.  Normal WBC.  H/H 11.9/36.4 with normal MCV and RDW.  No recent abdominal imaging for review. Normal BMP (K 3.4) in 09/2018. Was started on Imodium- no improvement. Was given Rx for Rifaximin yesterday- not yet approved by insurance.    Patient has previously refused colonoscopy/bowel prep per daughter.  Not sure when last  colonoscopy was or findings.  Past medical history, past surgical history, social history, family history, medications, and allergies reviewed in the chart and with patient over the phone.  Past Medical History:  Diagnosis Date  . Arthritis   . Back pain 08/15/2014  . DDD (degenerative disc disease) 03/26/2014  . H/O measles   . H/O mumps   . History of chicken pox   . History of tobacco use 03/26/2014  . Hyperlipidemia   . Hypothyroid   . Insomnia 03/26/2014  . Neurocognitive disorder   . Thyroid disease 01-01-14     Past Surgical History:  Procedure Laterality Date  . ABDOMINAL HYSTERECTOMY  78 yrs old   partial  . FRACTURE SURGERY    . FRACTURE SURGERY  10/06/2018   T-9 Outpatient procedure  . herniated disc  2005  . TONSILLECTOMY  51 yrs ago  . VARICOSE VEIN SURGERY Bilateral   . WISDOM TOOTH EXTRACTION     Family History  Problem Relation Age of Onset  . Cancer Father   . Leukemia Father   . Cataracts Paternal Grandmother   . Colon cancer Neg Hx   . Esophageal cancer Neg Hx    Social History   Tobacco Use  . Smoking status: Former Smoker    Packs/day: 1.50    Years: 53.00    Pack years: 79.50    Types: Cigarettes    Start date: 10/22/2010    Last attempt to quit: 11/14/2017    Years since quitting: 1.3  . Smokeless tobacco: Never Used  Substance Use Topics  .  Alcohol use: No  . Drug use: Never   Current Outpatient Medications  Medication Sig Dispense Refill  . atorvastatin (LIPITOR) 10 MG tablet Take 1 tablet (10 mg total) by mouth at bedtime. 90 tablet 1  . levothyroxine (SYNTHROID, LEVOTHROID) 50 MCG tablet Take 1 tablet (50 mcg total) by mouth daily before breakfast. 90 tablet 1  . Multiple Vitamin (MULTIVITAMIN) tablet Take 1 tablet by mouth daily.    Marland Kitchen OLANZapine (ZYPREXA) 2.5 MG tablet Take 2.5 mg by mouth at bedtime.    . Probiotic Product (PROBIOTIC PO) Take 2 tablets by mouth 2 (two) times daily. Takes 2 gummies 2 times daily    . traMADol (ULTRAM)  50 MG tablet TAKE ONE TABLET BY MOUTH EVERY 6 HOURS AS NEEDED (Patient taking differently: 100 mg 3 (three) times daily as needed. ) 120 tablet 0  . traZODone (DESYREL) 100 MG tablet Take 100 mg by mouth at bedtime.    . Cyanocobalamin (B-12) 500 MCG SUBL Place 500 mcg under the tongue daily. (Patient not taking: Reported on 03/11/2019) 90 tablet 2  . rifaximin (XIFAXAN) 550 MG TABS tablet Take 1 tablet (550 mg total) by mouth 3 (three) times daily for 14 days. (Patient not taking: Reported on 03/11/2019) 42 tablet 0   No current facility-administered medications for this visit.    Allergies  Allergen Reactions  . Codeine Nausea Only     Review of Systems: All systems reviewed and negative except where noted in HPI.     Physical Exam:    Physical exam not completed due to the nature of this telehealth communication.  Patient was otherwise alert and oriented and well communicative.   ASSESSMENT AND PLAN;   1) Diarrhea: 78 year old female with dementia with watery, nonbloody diarrhea.  Work-up to date has been essentially unrevealing for etiology.  - Check ESR, CRP, fecal calprotectin -Check TSH -Check GI PCR panel for extended infectious work-up -Check pancreatic fecal elastase -Check be met for electrolyte abnormalities -Discussed medication side effect as possible etiology.  Zyprexa carries 4% risk, Lipitor 7 to 14%, trazodone 5%, and Ultram 5 to 10% -Discussed EGD and colonoscopy with biopsies.  Given her dementia, daughter does not think that she would cooperate with a bowel prep.  Given this, I do not think it would be unreasonable that if the above work-up is unrevealing, to proceed with EGD with duodenal biopsies and flexible sigmoidoscopy with biopsies to evaluate for IBD, MC, etc.  Daughter agrees this might be a viable approach pending the above studies -Okay to start course of Lomotil 2.5 mg every 6 hours until stools have slowed then decrease to lowest effective dose or as  needed. Can increase to 5 mg/dose if needed as well -Resume diet diary -Resume to encourage adequate fluid intake - Agree with trial of rifaximin when this is approved -Start probiotic again at the tail end of the rifaximin course, and continue for at least 2 weeks post rifaximin completion for repopulation - Fiber supplement would still be okay - RTC in 2 to 3 months or sooner as needed  2) Dementia   Dominic Pea Beretta Ginsberg, DO, FACG  03/11/2019, 3:25 PM   Ottis Stain*

## 2019-03-11 NOTE — Patient Instructions (Signed)
To help prevent the possible spread of infection to our patients, communities, and staff; we will be implementing the following measures:  As of now we are not allowing any visitors/family members to accompany you to any upcoming appointments with [facility]. If you have any concerns about this please contact our office to discuss prior to the appointment.    We have sent the following medications to your pharmacy for you to pick up at your convenience: Lomotil 5mg  every 6 hours then decrease to every 6 hours as needed.

## 2019-03-12 ENCOUNTER — Telehealth: Payer: Self-pay | Admitting: Gastroenterology

## 2019-03-12 NOTE — Telephone Encounter (Signed)
Pt daughter called and wanted to speak with the nurse about the medication for her mother. She stated that when she took it it made her very sick and want to know if she should still give it to her.

## 2019-03-12 NOTE — Telephone Encounter (Signed)
GI has provided recommendations

## 2019-03-12 NOTE — Telephone Encounter (Signed)
Spoke with Katherine Griffith the patients daughter who states Katherine Griffith had extreme nausea after taking her first dose this morning and wanted to know if she should continue to give the medication or stop. Per Dr. Melanee Left he would like her to hold off on taking the medication anymore today. Start again tomorrow and discontinue use if still with side effects. Patients daughter will call use back tomorrow if still with problems.

## 2019-03-20 ENCOUNTER — Other Ambulatory Visit: Payer: Self-pay

## 2019-03-20 ENCOUNTER — Other Ambulatory Visit (INDEPENDENT_AMBULATORY_CARE_PROVIDER_SITE_OTHER): Payer: Medicare HMO

## 2019-03-20 DIAGNOSIS — F0391 Unspecified dementia with behavioral disturbance: Secondary | ICD-10-CM

## 2019-03-20 DIAGNOSIS — R197 Diarrhea, unspecified: Secondary | ICD-10-CM

## 2019-03-20 NOTE — Addendum Note (Signed)
Addended by: Kelle Darting A on: 03/20/2019 10:22 AM   Modules accepted: Orders

## 2019-03-21 LAB — BASIC METABOLIC PANEL
BUN: 20 mg/dL (ref 7–25)
CO2: 27 mmol/L (ref 20–32)
Calcium: 9.4 mg/dL (ref 8.6–10.4)
Chloride: 104 mmol/L (ref 98–110)
Creat: 0.85 mg/dL (ref 0.60–0.93)
Glucose, Bld: 117 mg/dL — ABNORMAL HIGH (ref 65–99)
Potassium: 3.9 mmol/L (ref 3.5–5.3)
Sodium: 141 mmol/L (ref 135–146)

## 2019-03-21 LAB — C-REACTIVE PROTEIN: CRP: 2.5 mg/L (ref <8.0)

## 2019-03-21 LAB — SEDIMENTATION RATE: Sed Rate: 25 mm/h (ref 0–30)

## 2019-03-21 LAB — TSH: TSH: 68.01 mIU/L — ABNORMAL HIGH (ref 0.40–4.50)

## 2019-03-23 ENCOUNTER — Other Ambulatory Visit: Payer: Medicare HMO

## 2019-03-23 DIAGNOSIS — F0391 Unspecified dementia with behavioral disturbance: Secondary | ICD-10-CM

## 2019-03-23 DIAGNOSIS — R197 Diarrhea, unspecified: Secondary | ICD-10-CM

## 2019-03-24 ENCOUNTER — Telehealth: Payer: Self-pay | Admitting: Physician Assistant

## 2019-03-24 DIAGNOSIS — R7989 Other specified abnormal findings of blood chemistry: Secondary | ICD-10-CM

## 2019-03-24 DIAGNOSIS — E038 Other specified hypothyroidism: Secondary | ICD-10-CM

## 2019-03-24 LAB — CELIAC PANEL 10
Antigliadin Abs, IgA: 5 units (ref 0–19)
Endomysial IgA: NEGATIVE
Gliadin IgG: 1 units (ref 0–19)
IgA/Immunoglobulin A, Serum: 207 mg/dL (ref 64–422)
Tissue Transglut Ab: <2 U/mL (ref 0–5)
Transglutaminase IgA: <2 U/mL (ref 0–3)

## 2019-03-24 MED ORDER — LEVOTHYROXINE SODIUM 75 MCG PO TABS: 75.0000 ug | ORAL_TABLET | Freq: Every day | ORAL | 0 refills | Status: DC

## 2019-03-24 NOTE — Telephone Encounter (Signed)
Called and spoke to patient's daughter Katherine Griffith regarding abnormal TSH Daughter states that she is taking Levothyroxine 50 mcg every day with breakfast Advised to take on an empty stomach with small sip of water by itself 30 mins before breakfast Increasing dose of Levothyroxine to 75 mcg Recheck TSH in 6 weeks

## 2019-03-27 ENCOUNTER — Other Ambulatory Visit: Payer: Self-pay

## 2019-03-27 ENCOUNTER — Other Ambulatory Visit (INDEPENDENT_AMBULATORY_CARE_PROVIDER_SITE_OTHER): Payer: Medicare HMO

## 2019-03-27 DIAGNOSIS — R197 Diarrhea, unspecified: Secondary | ICD-10-CM | POA: Diagnosis not present

## 2019-03-27 DIAGNOSIS — F0391 Unspecified dementia with behavioral disturbance: Secondary | ICD-10-CM

## 2019-04-01 ENCOUNTER — Ambulatory Visit: Payer: Medicare HMO

## 2019-04-01 ENCOUNTER — Other Ambulatory Visit: Payer: Medicare HMO

## 2019-04-02 LAB — GASTROINTESTINAL PATHOGEN PANEL PCR
C. difficile Tox A/B, PCR: NOT DETECTED
Campylobacter, PCR: NOT DETECTED
Cryptosporidium, PCR: NOT DETECTED
E coli (ETEC) LT/ST PCR: NOT DETECTED
E coli (STEC) stx1/stx2, PCR: NOT DETECTED
E coli 0157, PCR: NOT DETECTED
Giardia lamblia, PCR: NOT DETECTED
Norovirus, PCR: NOT DETECTED
Rotavirus A, PCR: NOT DETECTED
Salmonella, PCR: NOT DETECTED
Shigella, PCR: NOT DETECTED

## 2019-04-02 LAB — PANCREATIC ELASTASE, FECAL: Pancreatic Elastase-1, Stool: 80 mcg/g — ABNORMAL LOW

## 2019-04-06 LAB — CALPROTECTIN, FECAL: Calprotectin, Fecal: 81 ug/g (ref 0–120)

## 2019-04-07 ENCOUNTER — Other Ambulatory Visit: Payer: Self-pay

## 2019-04-07 DIAGNOSIS — R948 Abnormal results of function studies of other organs and systems: Secondary | ICD-10-CM

## 2019-04-07 DIAGNOSIS — R197 Diarrhea, unspecified: Secondary | ICD-10-CM

## 2019-04-07 MED ORDER — PANCRELIPASE (LIP-PROT-AMYL) 36000-114000 UNITS PO CPEP: 36000.0000 [IU] | ORAL_CAPSULE | ORAL | 6 refills | Status: AC

## 2019-04-07 MED ORDER — PANCRELIPASE (LIP-PROT-AMYL) 24000-76000 UNITS PO CPEP: 72000.0000 [IU] | ORAL_CAPSULE | Freq: Three times a day (TID) | ORAL | 6 refills | Status: AC

## 2019-04-13 ENCOUNTER — Telehealth: Payer: Self-pay | Admitting: Gastroenterology

## 2019-04-13 NOTE — Telephone Encounter (Signed)
Patient would like to know how to take the medication CREON

## 2019-04-15 ENCOUNTER — Telehealth: Payer: Self-pay | Admitting: Gastroenterology

## 2019-04-15 ENCOUNTER — Other Ambulatory Visit: Payer: Self-pay

## 2019-04-15 DIAGNOSIS — R948 Abnormal results of function studies of other organs and systems: Secondary | ICD-10-CM

## 2019-04-15 NOTE — Telephone Encounter (Signed)
Notified patient to take 72,000u with meals and 36,000u with snacks. Patient and daughter verbalized understanding of orders.

## 2019-04-15 NOTE — Telephone Encounter (Signed)
[facility] calling to inform that they need creatine levels before CT scan. Pt is scheduled for Ct scan on 7/7 at 10:30am. They state that you can send order to [facility] to have blood drawn before her imaging test. Order needs to say I-stat creatine.

## 2019-04-21 ENCOUNTER — Telehealth: Payer: Self-pay | Admitting: Gastroenterology

## 2019-04-21 NOTE — Telephone Encounter (Signed)
Pt's daughter would like to know whether pt can resume anti diarrheal medication while on Creon.

## 2019-04-23 NOTE — Telephone Encounter (Signed)
Notified patient that she can continue to take PRN anti-diarrheal medication with Creon.

## 2019-04-28 ENCOUNTER — Encounter (HOSPITAL_COMMUNITY): Payer: Self-pay

## 2019-04-28 ENCOUNTER — Other Ambulatory Visit: Payer: Self-pay

## 2019-04-28 ENCOUNTER — Ambulatory Visit (HOSPITAL_COMMUNITY)
Admission: RE | Admit: 2019-04-28 | Discharge: 2019-04-28 | Disposition: A | Discharge status: Home / Self Care | Payer: Medicare HMO | Source: Ambulatory Visit | Attending: Gastroenterology | Admitting: Gastroenterology

## 2019-04-28 DIAGNOSIS — R197 Diarrhea, unspecified: Secondary | ICD-10-CM | POA: Insufficient documentation

## 2019-04-28 DIAGNOSIS — R948 Abnormal results of function studies of other organs and systems: Secondary | ICD-10-CM | POA: Insufficient documentation

## 2019-04-28 MED ORDER — SODIUM CHLORIDE (PF) 0.9 % IJ SOLN
INTRAMUSCULAR | Status: AC
  Filled 2019-04-28: qty 50

## 2019-04-28 MED ORDER — IOHEXOL 300 MG/ML  SOLN
100.0000 mL | Freq: Once | INTRAMUSCULAR | Status: AC | PRN
  Administered 2019-04-28: 11:00:00 100 mL via INTRAVENOUS

## 2019-05-05 ENCOUNTER — Telehealth: Payer: Medicare HMO | Admitting: Gastroenterology

## 2019-05-07 NOTE — Telephone Encounter (Signed)
Patient's daughter is questioning what the plan is from here. She was hoping the CT would provide additional information as to why her mother would be having diarrhea multiple times a day. She wants to know if she needs to continue taking Creon stating that it does not help. She also wants to know if Lomotil will be something she takes forever. She is just wondering what the next steps are.

## 2019-05-12 NOTE — Telephone Encounter (Signed)
The goal of the CT was to ensure that there was no pancreatic pathology given the low pancreatic fecal elastase.  The CT was reassuring that there is no pancreatic pathology (i.e. mass).  The Lomotil is safe to continue, but agree that the goal would be to be able to control her symptoms with the Creon and dietary modifications.  If not already doing, I recommend adding fiber supplement such as Citrucel, Benefiber, or Metamucil to bulk up the stools.  Continue the Creon 72,000 units with meals and 36,000 units with snacks for now, with plan to follow-up in the GI clinic.  Thank you.

## 2019-05-13 ENCOUNTER — Telehealth: Payer: Self-pay | Admitting: Gastroenterology

## 2019-05-13 NOTE — Telephone Encounter (Signed)
Patient daughter called said that she got the CT results last week 7/16 and has not heard back on what is the next step

## 2019-05-13 NOTE — Telephone Encounter (Signed)
I spoke with the patients daughter and recommended her to introduce a fiber supplement into her mothers diet. She admitted to not giving her mother the Creon as prescribed.  I explained to her that her mother needed to take the Creon to replace the enzymes that her pancreas is no longer producing. She has agreed to continue current medications and to add fiber. She will continue to use Lomotil prn. Patients daughter will call us if anymore questions come up before her scheduled follow-up on 05/20/2019.

## 2019-05-20 ENCOUNTER — Telehealth (INDEPENDENT_AMBULATORY_CARE_PROVIDER_SITE_OTHER): Payer: Medicare HMO | Admitting: Gastroenterology

## 2019-05-20 ENCOUNTER — Other Ambulatory Visit: Payer: Self-pay

## 2019-05-20 ENCOUNTER — Encounter: Payer: Self-pay | Admitting: Gastroenterology

## 2019-05-20 VITALS — Ht 61.0 in | Wt 124.0 lb

## 2019-05-20 DIAGNOSIS — K8681 Exocrine pancreatic insufficiency: Secondary | ICD-10-CM | POA: Diagnosis not present

## 2019-05-20 DIAGNOSIS — Q453 Other congenital malformations of pancreas and pancreatic duct: Secondary | ICD-10-CM | POA: Diagnosis not present

## 2019-05-20 NOTE — Progress Notes (Signed)
            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  No improvement with initial trial of Imodium and fiber supplement.  Previously discussed medication side effect as possible confounding factor;  Zyprexa carries 4% risk, Lipitor 7 to 14%, trazodone 5%, and Ultram 5 to 10%  Evaluation to date: - C. difficile, stool culture, O&P negative.  Normal WBC. - Normal ESR, CRP, fecal calprotectin, GI PCR panel, BMP, Celiac panel - Low pancreatic fecal elastase (80).  Started on Creon -CT pancreas (04/2019): Normal pancreas, incidental P divisum.  Atherosclerosis.  Moderate stool throughout the visualized colon -EGD/colonoscopy previously declined due to dementia   HPI:    Due to current restrictions/limitations of in-office visits due to the COVID-19 pandemic, this scheduled clinical appointment was converted to a telehealth virtual consultation using Doximity.  -Time: 21 minutes -The patient did consent to this virtual visit and is aware of possible charges through their insurance for this visit.  -Names of all parties present: Katherine Griffith (patient), Morgan, Johanna (patient's daughter),  , DO, FACG (physician) -Patient location: Home -Physician location: Office  Katherine Griffith is a 78 y.o. female presenting to the Gastroenterology Clinic for routine f/u. She was last seen by me in 02/2019, and has since been diagnosed with EPI. Was prescribed Creon, but her daughter had not hadn't started giving this routinely until last week. Has continued Lomotil qhs. Has noticed significant improvement in the last 2 days since starting Creon 72,000 units/meal and 36,000 units/snack.  Patient is otherwise without complaints today.  Tolerating well p.o. intake.  Past medical history, past surgical  history, social history, family history, medications, and allergies reviewed in the chart and with patient.    Past Medical History:  Diagnosis Date  . Arthritis   . Back pain 08/15/2014  . DDD (degenerative disc disease) 03/26/2014  . H/O measles   . H/O mumps   . History of chicken pox   . History of tobacco use 03/26/2014  . Hyperlipidemia   . Hypothyroid   . Insomnia 03/26/2014  . Neurocognitive disorder   . Thyroid disease 01-01-14     Past Surgical History:  Procedure Laterality Date  . ABDOMINAL HYSTERECTOMY  78 yrs old   partial  . FRACTURE SURGERY    . FRACTURE SURGERY  10/06/2018   T-9 Outpatient procedure  . herniated disc  2005  . TONSILLECTOMY  51 yrs ago  . VARICOSE VEIN SURGERY Bilateral   . WISDOM TOOTH EXTRACTION     Family History  Problem Relation Age of Onset  . Cancer Father   . Leukemia Father   . Cataracts Paternal Grandmother   . Colon cancer Neg Hx   . Esophageal cancer Neg Hx    Social History   Tobacco Use  . Smoking status: Former Smoker    Packs/day: 1.50    Years: 53.00    Pack years: 79.50    Types: Cigarettes    Start date: 10/22/2010    Quit date: 11/14/2017    Years since quitting: 1.5  . Smokeless tobacco: Never Used  Substance Use Topics  . Alcohol use: No  . Drug use: Never   Current Outpatient Medications  Medication Sig Dispense Refill  . atorvastatin (LIPITOR) 10 MG tablet Take 1 tablet (10 mg total) by mouth at bedtime. 90   tablet 1  . Cyanocobalamin (B-12) 500 MCG SUBL Place 500 mcg under the tongue daily. 90 tablet 2  . diphenoxylate-atropine (LOMOTIL) 2.5-0.025 MG tablet Take 1 tablet by mouth every 6 (six) hours as needed for diarrhea or loose stools. Begin taking every 6 hours then decrease as tolerated. 120 tablet 1  . levothyroxine (SYNTHROID) 75 MCG tablet Take 1 tablet (75 mcg total) by mouth daily before breakfast. 90 tablet 0  . lipase/protease/amylase (CREON) 36000 UNITS CPEP capsule Take 1 capsule (36,000 Units  total) by mouth as directed. With snacks. 180 capsule 6  . Multiple Vitamin (MULTIVITAMIN) tablet Take 1 tablet by mouth daily.    Marland Kitchen OLANZapine (ZYPREXA) 2.5 MG tablet Take 2.5 mg by mouth at bedtime.    . Pancrelipase, Lip-Prot-Amyl, 24000-76000 units CPEP Take 3 capsules (72,000 Units total) by mouth 3 (three) times daily with meals. 180 capsule 6  . Probiotic Product (PROBIOTIC PO) Take 2 tablets by mouth 2 (two) times daily. Takes 2 gummies 2 times daily    . traMADol (ULTRAM) 50 MG tablet TAKE ONE TABLET BY MOUTH EVERY 6 HOURS AS NEEDED (Patient taking differently: 100 mg 3 (three) times daily as needed. ) 120 tablet 0  . traZODone (DESYREL) 100 MG tablet Take 100 mg by mouth at bedtime.     No current facility-administered medications for this visit.    Allergies  Allergen Reactions  . Codeine Nausea Only     Review of Systems: All systems reviewed and negative except where noted in HPI.     Physical Exam:    Complete physical exam not completed due to the nature of this telehealth communication.   Gen: Awake, alert HEENT: EOMI, non-icteric sclera, NCAT, MMM Neck: Normal movement of head and neck Pulm: No labored breathing, speaking in full sentences without conversational dyspnea Derm: No apparent lesions or bruising in visible field MS: Moves all visible extremities without noticeable abnormality   ASSESSMENT AND PLAN;   1) Exocrine Pancreatic Insufficiency: -Resume Creon 72,000 units/meal and 36,000 units/snack - She was prescribed 2 different types of pancreatic enzymes (Creon 36,000 units along with a 24,000 unit).  When it comes time to refill of the medications, recommended consolidating to just 1 type for ease of use -Okay to use Lomotil, but changed to prn use - Reviewed CT findings with her, to include incidental note of P divisum.  No history of pancreatitis. - Check fat-soluble vitamins at follow-up  2) Pancreatic divisum: -Incidentally noted on recent CT.   While PD obstruction could be a cause of EPI, she has no history of pancreatitis and the pancreatic parenchyma is otherwise normal-appearing.  Discussed P divisum as a possible (although no literature reports noted on brief review) etiology, do not feel that intervention with minor papilla sphincterotomy would be warranted at this juncture.  Can always revisit pending response to therapy as above and if additional data are available regarding this connection.   RTC in 3 to 4 months or sooner as needed  I spent over 15 minutes of virtual face-to-face time with the patient and her daughter. Greater than 50% of the time was spent counseling and coordinating care.    Lavena Bullion, DO, FACG  05/20/2019, 10:07 AM   Ottis Stain*

## 2019-05-20 NOTE — Patient Instructions (Signed)
If you are age 77 or older, your body mass index should be between 23-30. Your Body mass index is 23.43 kg/m. If this is out of the aforementioned range listed, please consider follow up with your Primary Care Provider.  If you are age 30 or younger, your body mass index should be between 19-25. Your Body mass index is 23.43 kg/m. If this is out of the aformentioned range listed, please consider follow up with your Primary Care Provider.   To help prevent the possible spread of infection to our patients, communities, and staff; we will be implementing the following measures:  As of now we are not allowing any visitors/family members to accompany you to any upcoming appointments with [facility]. If you have any concerns about this please contact our office to discuss prior to the appointment.   Please call our office at [phone] to set up your 3-4 month follow up visit.  It was a pleasure to see you today!  Vito Cirigliano, D.O.

## 2019-06-24 ENCOUNTER — Other Ambulatory Visit: Payer: Self-pay | Admitting: Physician Assistant

## 2019-06-24 DIAGNOSIS — E038 Other specified hypothyroidism: Secondary | ICD-10-CM

## 2019-08-05 ENCOUNTER — Telehealth: Payer: Self-pay | Admitting: Gastroenterology

## 2019-08-05 NOTE — Telephone Encounter (Signed)
Pt's daughter reported that Creon has worked 60% and requested to be prescribed something stronger.  She stated that she is still cleaning pt's bathroom.

## 2019-08-05 NOTE — Telephone Encounter (Signed)
Please advise on next step in plan of care

## 2019-08-06 NOTE — Telephone Encounter (Signed)
Please verify that she is taking Creon at a dose of 72,000 U with meals and 36,000 units with snacks. Additionally, please confirm she is using Lomotil (prn) and low fat diet. If doing all that but still with sxs, plan to add:  - Cholestyramine 4 gm daily. Take >2 hours from other medications. RF5

## 2019-08-06 NOTE — Telephone Encounter (Signed)
Called and spoke with patient's daughter-Johanna-verified DPR-Johanna informed of MD recommendations;  Katherine Griffith is agreeable with plan of care; Katherine Griffith verbalized understanding of information/instructions;  Katherine Griffith was advised to call the office at [phone] if questions/concerns arise; RX was not sent in due to patient not taking Creon nor Lomotil appropriately-advised Katherine Griffith of appropriate dosages and advised her to call back to the office in/about a week to update office on patient status with diarrhea and additional medication would be sent in for symptoms;

## 2019-09-07 ENCOUNTER — Other Ambulatory Visit: Payer: Self-pay | Admitting: Gastroenterology

## 2019-09-07 MED ORDER — DIPHENOXYLATE-ATROPINE 2.5-0.025 MG PO TABS: 1.0000 | ORAL_TABLET | Freq: Four times a day (QID) | ORAL | 1 refills | Status: AC | PRN

## 2019-09-21 ENCOUNTER — Ambulatory Visit (INDEPENDENT_AMBULATORY_CARE_PROVIDER_SITE_OTHER): Payer: Medicare HMO

## 2019-09-21 ENCOUNTER — Other Ambulatory Visit: Payer: Self-pay

## 2019-09-21 ENCOUNTER — Encounter: Payer: Self-pay | Admitting: Physician Assistant

## 2019-09-21 ENCOUNTER — Ambulatory Visit (INDEPENDENT_AMBULATORY_CARE_PROVIDER_SITE_OTHER): Payer: Medicare HMO | Admitting: Physician Assistant

## 2019-09-21 VITALS — Ht 61.0 in | Wt 125.0 lb

## 2019-09-21 DIAGNOSIS — R062 Wheezing: Secondary | ICD-10-CM

## 2019-09-21 DIAGNOSIS — F0391 Unspecified dementia with behavioral disturbance: Secondary | ICD-10-CM

## 2019-09-21 DIAGNOSIS — R0601 Orthopnea: Secondary | ICD-10-CM

## 2019-09-21 DIAGNOSIS — R600 Localized edema: Secondary | ICD-10-CM | POA: Insufficient documentation

## 2019-09-21 DIAGNOSIS — R05 Cough: Secondary | ICD-10-CM

## 2019-09-21 DIAGNOSIS — R0602 Shortness of breath: Secondary | ICD-10-CM | POA: Insufficient documentation

## 2019-09-21 DIAGNOSIS — R059 Cough, unspecified: Secondary | ICD-10-CM | POA: Insufficient documentation

## 2019-09-21 MED ORDER — ALBUTEROL SULFATE HFA 108 (90 BASE) MCG/ACT IN AERS: 2.0000 | INHALATION_SPRAY | Freq: Four times a day (QID) | RESPIRATORY_TRACT | 0 refills | Status: AC | PRN

## 2019-09-21 NOTE — Progress Notes (Signed)
Tested Friday for Covid - no results back yet.   2-3 month history of wheezing, was occasional, now all the time Can't walk from bedroom to bathroom without wheezing  No other symptoms

## 2019-09-21 NOTE — Progress Notes (Signed)
Patient ID: Katherine Griffith, female   DOB: 1940/11/26, 78 y.o.   MRN: [id number] .Marland KitchenVirtual Visit via Video Note  I connected with Katherine Griffith on 09/21/19 at  1:40 PM EST by a telephone enabled telemedicine application and verified that I am speaking with the correct person using two identifiers.  Location: Patient: home Provider: clinic   I discussed the limitations of evaluation and management by telemedicine and the availability of in person appointments. The patient expressed understanding and agreed to proceed.  History of Present Illness: Patient is a 78 year old female with dementia she was accompanied by her daughter for a telephone visit.  She was not able to get the video Doximity up and running.  Daughter did most of the talking for patient.  Daughter reports 2 to 3 months of wheezing and SOB. househould contact had covid and had testing done this weekend. No prior hx of lung disease(asthma or COPD). She is a daily smoker. Daughter states her mother sleeps on pillows with bed raised to breathe better and has had some lower ext edema. She denies any fever, chills, body aches, loss of smell or taste. She is weak. Walking is hard due to sOB.   .. Active Ambulatory Problems    Diagnosis Date Noted  . Other specified hypothyroidism 07/04/2017  . OA (osteoarthritis) 08/18/2013  . Anemia 03/26/2014  . 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 Other malaise and fatigue 03/26/2014  . Preventative health care 03/26/2014  . History of tobacco use 03/26/2014  . DDD (degenerative disc disease) 03/26/2014  . Insomnia 03/26/2014  . Elevated BP 08/15/2014  . Back pain 08/15/2014  . Dementia with behavioral disturbance ([name]) 11/18/2018  . Akathisia 11/12/2018  . Asymptomatic bacteriuria 11/22/2018  . Bilateral knee pain 08/04/2013  . Opioid type dependence, continuous ([place]) 11/27/2017  . Prolonged Q-T interval on ECG 11/22/2018  . Radiculopathy, lumbar region 03/28/2017  . Diarrhea  01/07/2019  . Compression fracture of T9 vertebra ([place]) 01/07/2019  . Mixed hyperlipidemia 01/18/2019  . Cough 09/21/2019  . Wheezing 09/21/2019  . Orthopnea 09/21/2019  . Lower extremity edema 09/21/2019  . Shortness of breath 09/21/2019   Resolved Ambulatory Problems    Diagnosis Date Noted  . Thyroid disease 01/01/2014  . Upper respiratory infection 08/15/2014   Past Medical History:  Diagnosis Date  . Arthritis   . H/O measles   . H/O mumps   . History of chicken pox   . Hyperlipidemia   . Hypothyroid   . Neurocognitive disorder    Reviewed med, allergy, problem list.    Observations/Objective: No acute distress.  No cough on telephone.  No wheezing heard.  No labored breathing.    Not vitals were able to be obtained.    Assessment and Plan: Marland KitchenMarland KitchenVita was seen today for wheezing.  Diagnoses and all orders for this visit:  Wheezing -     CXR 2 view -     COMPLETE METABOLIC PANEL WITH GFR -     CBC w/Diff -     (BNP) Brain natriuretic peptide  Dementia with behavioral disturbance, unspecified dementia type (HCC)  Cough -     CXR 2 view -     COMPLETE METABOLIC PANEL WITH GFR -     CBC w/Diff -     (BNP) Brain natriuretic peptide  Lower extremity edema -     COMPLETE METABOLIC PANEL WITH GFR -     CBC w/Diff -     (BNP) Brain natriuretic peptide  Orthopnea -  COMPLETE METABOLIC PANEL WITH GFR -     CBC w/Diff -     (BNP) Brain natriuretic peptide  Shortness of breath   Concern for CHF vs Pneumonia vs bronchitis. Needs stat xray and labs. Ongoing symptoms for months so concerning for fluid overload. No prior hx of this. No prior hx of lung disease however pt is a smoker. Albuterol inhaler sent to use as needed. covid testing pending. She does have direct covid contact. If breathing worsens need to go to ED.    Follow Up Instructions:    I discussed the assessment and treatment plan with the patient. The patient was provided an opportunity to  ask questions and all were answered. The patient agreed with the plan and demonstrated an understanding of the instructions.   The patient was advised to call back or seek an in-person evaluation if the symptoms worsen or if the condition fails to improve as anticipated.  I provided 12 minutes of non-face-to-face time during this encounter.   Iran Planas, PA-C

## 2019-09-22 ENCOUNTER — Other Ambulatory Visit: Payer: Self-pay | Admitting: Physician Assistant

## 2019-09-22 DIAGNOSIS — E782 Mixed hyperlipidemia: Secondary | ICD-10-CM

## 2019-09-22 MED ORDER — PREDNISONE 50 MG PO TABS: ORAL_TABLET | ORAL | 0 refills | Status: DC

## 2019-09-22 NOTE — Addendum Note (Signed)
Addended by: Donella Stade on: 09/22/2019 12:41 PM   Modules accepted: Orders

## 2019-09-22 NOTE — Progress Notes (Signed)
Sent prednisone

## 2019-09-22 NOTE — Progress Notes (Signed)
I sent albuterol for patient to try last night to help with wheezing did she get this? Did it help? Do we have covid results yet? No fluid or pneumonia seen on lungs. With smoking history this is likely inflammation. I would like to send over course of prednisone. Has patient ever had this? Did she tolerate ok?

## 2019-09-28 MED ORDER — PHARMALOX 225-200 MG/5ML PO SUSP
5.00 | ORAL | Status: DC
Start: ? — End: 2019-09-28

## 2019-09-28 MED ORDER — CELLULOSE SODIUM PHOSPHATE VI
5000.00 | Status: DC
Start: 2019-09-29 — End: 2019-09-28

## 2019-09-28 MED ORDER — Medication
1.00 | Status: DC
Start: 2019-09-30 — End: 2019-09-28

## 2019-09-28 MED ORDER — ARMOUR THYROID 30 MG PO TABS
2.00 | ORAL_TABLET | ORAL | Status: DC
Start: 2019-09-29 — End: 2019-09-28

## 2019-09-28 MED ORDER — GENAPAP 325 MG PO TABS
2.00 | ORAL_TABLET | ORAL | Status: DC
Start: ? — End: 2019-09-28

## 2019-09-28 MED ORDER — Medication
100.00 | Status: DC
Start: 2019-09-30 — End: 2019-09-28

## 2019-09-28 MED ORDER — HYOSOL/SL 0.125 MG SL SUBL
1.00 | SUBLINGUAL_TABLET | SUBLINGUAL | Status: DC
Start: 2019-09-30 — End: 2019-09-28

## 2019-09-28 MED ORDER — LAXATIVE PLUS STOOL SOFTENER 30-100 MG PO CAPS
6.00 | ORAL_CAPSULE | ORAL | Status: DC
Start: 2019-09-29 — End: 2019-09-28

## 2019-09-28 MED ORDER — SPINAL NEEDLE (REUSABLE) 22G X 3-1/2" MISC
40.00 | Status: DC
Start: 2019-09-28 — End: 2019-09-28

## 2019-09-28 MED ORDER — SAXAGLIPTIN HCL 5 MG PO TABS
72000.00 | ORAL_TABLET | ORAL | Status: DC
Start: 2019-09-29 — End: 2019-09-28

## 2019-09-28 MED ORDER — OLANZAPINE-FLUOXETINE HCL 6-50 MG PO CAPS
6.00 | ORAL_CAPSULE | ORAL | Status: DC
Start: ? — End: 2019-09-28

## 2019-09-28 MED ORDER — PAIN PM EXTRA STRENGTH 500-25 MG PO TABS
1.00 | ORAL_TABLET | ORAL | Status: DC
Start: ? — End: 2019-09-28

## 2019-09-28 MED ORDER — DIHYDROTACHYSTEROL 0.125 MG PO TABS
10.00 | ORAL_TABLET | ORAL | Status: DC
Start: 2019-09-30 — End: 2019-09-28

## 2019-09-28 MED ORDER — QUINERVA 260 MG PO TABS
650.00 | ORAL_TABLET | ORAL | Status: DC
Start: ? — End: 2019-09-28

## 2019-09-28 MED ORDER — GENTAMICIN SULFATE 10 MG/ML IJ SOLN
1.00 | INTRAMUSCULAR | Status: DC
Start: 2019-09-30 — End: 2019-09-28

## 2019-09-28 MED ORDER — BL ZINC-VITAMIN C LOZENGE MT
2.50 | OROMUCOSAL | Status: DC
Start: 2019-09-29 — End: 2019-09-28

## 2019-09-28 MED ORDER — Medication
500.00 | Status: DC
Start: 2019-09-30 — End: 2019-09-28

## 2019-09-28 MED ORDER — TRAMADOL HCL 50 MG PO TABS
50.00 | ORAL_TABLET | ORAL | Status: DC
Start: ? — End: 2019-09-28

## 2019-09-28 MED ORDER — ASPIRIN-SALICYLAMIDE-CAFFEINE 650-195-32 MG PO PACK
75.00 | PACK | ORAL | Status: DC
Start: 2019-09-30 — End: 2019-09-28

## 2019-09-28 MED ORDER — EQUATE NICOTINE 4 MG MT GUM
4.00 | CHEWING_GUM | OROMUCOSAL | Status: DC
Start: ? — End: 2019-09-28

## 2019-09-29 ENCOUNTER — Telehealth: Payer: Self-pay | Admitting: Neurology

## 2019-09-29 MED ORDER — DAYHIST-1 PO
7.50 | ORAL | Status: DC
Start: 2019-09-29 — End: 2019-09-29

## 2019-09-29 NOTE — Telephone Encounter (Signed)
Received after hours note that patient was having SOB, cough, fever, fatigue, glassy eyes. She went to ED. Per Luvenia Starch call and check on patient. Per review of records patient still admitted. Tested positive for Covid. FYI.

## 2019-09-29 NOTE — Telephone Encounter (Signed)
Thanks

## 2019-10-01 ENCOUNTER — Telehealth: Payer: Self-pay | Admitting: Neurology

## 2019-10-01 DIAGNOSIS — R0602 Shortness of breath: Secondary | ICD-10-CM

## 2019-10-01 DIAGNOSIS — R531 Weakness: Secondary | ICD-10-CM

## 2019-10-01 DIAGNOSIS — U071 COVID-19: Secondary | ICD-10-CM

## 2019-10-01 NOTE — Telephone Encounter (Signed)
Di Kindle, patient's caregiver, called and left vm stating she was released from the hospital. Still having shallow breathing after Covid diagnosis. She was given 5 days of IV medication. She would like a prescription for a hospital bed and walker with seat send to [facility] in [facility]. Ph- [phone] Fax - [phone]. Okay to write?

## 2019-10-02 DIAGNOSIS — R5381 Other malaise: Secondary | ICD-10-CM | POA: Insufficient documentation

## 2019-10-02 DIAGNOSIS — R531 Weakness: Secondary | ICD-10-CM | POA: Insufficient documentation

## 2019-10-02 NOTE — Telephone Encounter (Signed)
Prescriptions written. LMOM for Patient's daughter making her aware will fax to number given.

## 2019-10-02 NOTE — Telephone Encounter (Signed)
Ok for prescriptions to be written. Hospital bed is not always as easy to get approved but ok to send.

## 2019-10-02 NOTE — Telephone Encounter (Signed)
Prescriptions faxed to [facility] at [phone] (above number wrong) with confirmation received.

## 2019-10-05 ENCOUNTER — Other Ambulatory Visit: Payer: Self-pay | Admitting: Physician Assistant

## 2019-10-05 DIAGNOSIS — E038 Other specified hypothyroidism: Secondary | ICD-10-CM

## 2019-10-07 ENCOUNTER — Ambulatory Visit (INDEPENDENT_AMBULATORY_CARE_PROVIDER_SITE_OTHER): Payer: Medicare HMO | Admitting: Physician Assistant

## 2019-10-07 ENCOUNTER — Encounter: Payer: Self-pay | Admitting: Physician Assistant

## 2019-10-07 VITALS — Ht 61.0 in | Wt 125.0 lb

## 2019-10-07 DIAGNOSIS — R0602 Shortness of breath: Secondary | ICD-10-CM | POA: Diagnosis not present

## 2019-10-07 DIAGNOSIS — R5381 Other malaise: Secondary | ICD-10-CM

## 2019-10-07 DIAGNOSIS — Z8619 Personal history of other infectious and parasitic diseases: Secondary | ICD-10-CM

## 2019-10-07 DIAGNOSIS — R531 Weakness: Secondary | ICD-10-CM

## 2019-10-07 DIAGNOSIS — R630 Anorexia: Secondary | ICD-10-CM

## 2019-10-07 DIAGNOSIS — F0391 Unspecified dementia with behavioral disturbance: Secondary | ICD-10-CM

## 2019-10-07 DIAGNOSIS — Z8616 Personal history of COVID-19: Secondary | ICD-10-CM | POA: Insufficient documentation

## 2019-10-07 DIAGNOSIS — U071 COVID-19: Secondary | ICD-10-CM

## 2019-10-07 MED ORDER — IPRATROPIUM-ALBUTEROL 0.5-2.5 (3) MG/3ML IN SOLN: 3.0000 mL | RESPIRATORY_TRACT | 0 refills | Status: AC | PRN

## 2019-10-07 MED ORDER — MEGESTROL ACETATE 400 MG/10ML PO SUSP: 400.0000 mg | ORAL | 1 refills | Status: AC

## 2019-10-07 NOTE — Progress Notes (Signed)
Spoke with daughter Di Kindle: Feels like patient is "sleep walking" through her life Can't walk from bedroom to kitchen without stopping (doesn't know if confusion or SOB) Eyes glazed over all the time Needs more extensive care Has gotten worse since Covid diagnosis Wants to discuss nebulizer Remeron 7.5 mg given in hospital but causes restlessness, wants to know if can be moved to morning time Has someone who comes in to help with patient, but has not come since Covid diagnosis (from [place]) does not have home [place]

## 2019-10-07 NOTE — Progress Notes (Addendum)
Patient ID: Katherine Griffith, female   DOB: 12/14/1940, 78 y.o.   MRN: [id number] .Marland KitchenVirtual Visit via Video Note  I connected with Safina Shave on 10/07/19 at  8:30 AM EST by a video enabled telemedicine application and verified that I am speaking with the correct person using two identifiers.  Location: Patient: home Provider: clinic   I discussed the limitations of evaluation and management by telemedicine and the availability of in person appointments. The patient expressed understanding and agreed to proceed.  History of Present Illness: Pt is a 78 yo female with dementia and recent covid 19 infection whose daughter calls into the clinic for follow up. Seen virtually on 11/30 and given albuterol and encouraged to get covid tested. On 12/4 presented to ED with worsening breathing and weakness. She was discharged from hospital on 10/01/19. She had CXR that was clear in hospital. She was stating in the 80s and place on O2. She finished remdesivir, azithromycin, ceftriaxone in the hospital.  Patient's daughter feels like she is continued to go downhill since being discharged.  Patient is extremely weak.  Her olanzapine was stopped and she was started on Remeron to hopefully help with sleep and appetite.  It has helped with appetite but not with sleep.  Patient's daughter in fact finds that it has made her more awake and kept her up at night.  They have stopped for the last 2 days and she seems to be sleeping better at night.  Since stopping 2 days ago they noticed her appetite has decreased again.  Due to her dementia her speech and stuttering has worsened and communication with her is hard.  They were told that home health and PT would be ordered but they have not heard anything from the hospital.  She does get some relief from a nebulizer that a family member gave them. She wonders if she could get a refill of solution. She feels like her mother is really SOB. She is easily winded walking to the kitchen or  bathroom. daugher feels like she is "glazed" all the time. Patient lives with daughter but is needing extra support. daugher denies any leg pain or swelling. No access to pulse ox or blood pressure cuff. No fever, chills, wheezing. Cough continues to be dry.    .. Active Ambulatory Problems    Diagnosis Date Noted  . Other specified hypothyroidism 07/04/2017  . OA (osteoarthritis) 08/18/2013  . Anemia 03/26/2014  . 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 Other malaise and fatigue 03/26/2014  . Preventative health care 03/26/2014  . History of tobacco use 03/26/2014  . DDD (degenerative disc disease) 03/26/2014  . Insomnia 03/26/2014  . Elevated BP 08/15/2014  . Back pain 08/15/2014  . Dementia with behavioral disturbance ([place]) 11/18/2018  . Akathisia 11/12/2018  . Asymptomatic bacteriuria 11/22/2018  . Bilateral knee pain 08/04/2013  . Opioid type dependence, continuous ([place]) 11/27/2017  . Prolonged Q-T interval on ECG 11/22/2018  . Radiculopathy, lumbar region 03/28/2017  . Diarrhea 01/07/2019  . Compression fracture of T9 vertebra ([place]) 01/07/2019  . Mixed hyperlipidemia 01/18/2019  . Cough 09/21/2019  . Wheezing 09/21/2019  . Orthopnea 09/21/2019  . Lower extremity edema 09/21/2019  . Shortness of breath 09/21/2019  . Debility 10/02/2019  . No appetite 10/07/2019  . History of 2019 novel coronavirus disease (COVID-19) 10/07/2019  . COVID-19 10/07/2019   Resolved Ambulatory Problems    Diagnosis Date Noted  . Thyroid disease 01/01/2014  . Upper respiratory infection 08/15/2014   Past  Medical History:  Diagnosis Date  . Arthritis   . H/O measles   . H/O mumps   . History of chicken pox   . Hyperlipidemia   . Hypothyroid   . Neurocognitive disorder    Reviewed med, allergy, problem list.    Observations/Objective: No acute distress. Not able to see patient due to phone call.  Per pts daughter not in respiratory distress.   No way to get vitals.    .. Today's Vitals   10/07/19 0815  Weight: 125 lb (56.7 kg)  Height: 5\' 1"  (1.549 m)   Body mass index is 23.62 kg/m.    Assessment and Plan: Marland KitchenMarland KitchenVita was seen today for hospitalization follow-up.  Diagnoses and all orders for this visit:  Debility  COVID-19 -     Ambulatory referral to [place]  Dementia with behavioral disturbance, unspecified dementia type ([place]) -     Ambulatory referral to [place] of breath -     Ambulatory referral to [place] -     ipratropium-albuterol (DUONEB) 0.5-2.5 (3) MG/3ML SOLN; Take 3 mLs by nebulization every 4 (four) hours as needed. -     CXR 2 view  History of 2019 novel coronavirus disease (COVID-19) -     CXR 2 view  Weakness -     CXR 2 view  No appetite -     megestrol (MEGACE) 400 MG/10ML suspension; Take 10 mLs (400 mg total) by mouth every morning. For appetite stimulation. -     CXR 2 view   Patient definitely sounds very weak after her Covid experience. I do think patient needs an evaluation with home health, PT, speech. Also would like better vitals to assess her stability. Discussed with patient's daughter to get a pulse oximeter and check her oxygen status. If under 90 to call us or go to the emergency room. Going on daughters opinion patient does not seem to be in respiratory distress. Even more reason why we need a home health referral to assess patient stability. I do think Remeron could be causing some mental status changes and keeping her up at night. Stop Remeron and get back on olanzapine. Daughter concerned that patient will stop eating as it is very difficult to get her to eat when she was not on the Remeron. We will send over Megace to try. Okay to use nebulizer every 2-4 hours as needed for shortness of breath or wheezing or cough. I did go ahead and put in an order for a chest x-ray since patient is not improving. She can come back anytime and get this drawn. Follow up in 1 week.    Follow Up  Instructions:    I discussed the assessment and treatment plan with the patient. The patient was provided an opportunity to ask questions and all were answered. The patient agreed with the plan and demonstrated an understanding of the instructions.   The patient was advised to call back or seek an in-person evaluation if the symptoms worsen or if the condition fails to improve as anticipated.  I provided 30 minutes of non-face-to-face time during this encounter and with coordinated care and chart review.    Iran Planas, PA-C

## 2019-10-13 ENCOUNTER — Telehealth: Payer: Self-pay

## 2019-10-13 NOTE — Telephone Encounter (Signed)
Spoke with Katherine Griffith and gave verbal auth or speech therapy once a week for 4 weeks, and requested that she fax over documentation for completion. Fax number provided.

## 2019-10-13 NOTE — Telephone Encounter (Signed)
Junie Panning, Speech Therapist with [facility], is requesting verbal authorization for speech therapy once a week for 4 weeks. Tried to call her back to give verbal auth and request paperwork to be faxed for documentation and was not able to get a voicemail. Will try to call back at a later time.

## 2019-10-14 NOTE — Telephone Encounter (Signed)
Ok I have not seen paperwork but will sign when I see it.

## 2019-10-26 ENCOUNTER — Telehealth: Payer: Self-pay

## 2019-10-26 NOTE — Telephone Encounter (Signed)
Katherine Griffith with [facility] called requesting verbal orders to do a PT evaluation. Per standing orders, authorization given and requested that she fax over documentation for review and completion. Fax number provided.

## 2019-10-26 NOTE — Telephone Encounter (Signed)
Ok for verbal orders ?

## 2019-10-27 NOTE — Telephone Encounter (Signed)
Katherine Griffith with [facility] aware.

## 2019-10-30 ENCOUNTER — Telehealth: Payer: Self-pay

## 2019-10-30 NOTE — Telephone Encounter (Signed)
[place] for verbal to send to pharmacy which ever patient needs.

## 2019-10-30 NOTE — Telephone Encounter (Signed)
[place] for UA but needs to also do wet prep.  2. I do not see olanzapine on her list we need an updated med list. Once I get an up to date med list I could potentially send something over for sleep.

## 2019-10-30 NOTE — Telephone Encounter (Signed)
Katherine Griffith Course to collect a sample for a UA and for a wet prep. She stated she will collect on Monday. She will also call us with an updated medication list.   She was still waiting for an order for an albuterol spacer.

## 2019-10-30 NOTE — Telephone Encounter (Signed)
Aldona Bar with [facility] called stating patient's daughter is requesting an increase in Krebs. She is having trouble sleeping. I advised Aldona Bar this medication is not on her current medication list. It is in her past history. However, this was never prescribed by our providers.   She was also requesting a UA to be collected on Monday. When I asked the reason she didn't know. She states the RN requested she call us for an order. I advised we need to know the reason because it may not be good to wait until Monday.   Aldona Bar also requested a spacer for her inhaler. She states the patient's daughter states Teleshia is having trouble with the mouth piece.

## 2019-10-30 NOTE — Telephone Encounter (Signed)
Levada Dy advised Aldona Bar with [facility] of Iran Planas, PA-C's instructions.

## 2019-10-30 NOTE — Telephone Encounter (Signed)
Belenda Cruise with [facility] called and stated that they are wanting orders for a UA for itching and discomfort in the vaginal area. She states that they were told to take her to the ED (I am not able to find documentation that we told them that) but they are wanting to avoid a ED visit if they can.   In regards to sleeping, the daughter is requesting something to help her sleep. She is currently taking melatonin 20 mg every night and is still having problems sleeping.   Belenda Cruise can be reached at [phone] and states we can leave the information on her voicemail if she is not available.

## 2019-10-31 ENCOUNTER — Other Ambulatory Visit: Payer: Self-pay | Admitting: Gastroenterology

## 2019-11-02 MED ORDER — SPACER/AERO-HOLD CHAMBER BAGS MISC: 99 refills | Status: AC

## 2019-11-02 NOTE — Telephone Encounter (Signed)
Sent spacer to pharmacy. Still waiting for a call back from home health.

## 2019-11-02 NOTE — Addendum Note (Signed)
Addended by: Narda Rutherford on: 11/02/2019 03:16 PM   Modules accepted: Orders

## 2019-11-03 ENCOUNTER — Other Ambulatory Visit: Payer: Self-pay | Admitting: Family Medicine

## 2019-11-03 DIAGNOSIS — E038 Other specified hypothyroidism: Secondary | ICD-10-CM

## 2019-11-04 ENCOUNTER — Telehealth: Payer: Self-pay

## 2019-11-04 NOTE — Telephone Encounter (Signed)
Home Health calling to advise that patient had a fall last night in her bedroom. Patient has small nickel sized bruise above right eyebrow. No injuries reported.   FYI covering provider.

## 2019-11-05 NOTE — Telephone Encounter (Signed)
Make sure her fall risk is HIGH on chart.

## 2019-11-06 ENCOUNTER — Other Ambulatory Visit: Payer: Self-pay | Admitting: Gastroenterology

## 2019-11-06 MED ORDER — CREON 24000-76000 UNITS PO CPEP: ORAL_CAPSULE | ORAL | 3 refills | Status: AC

## 2019-11-09 NOTE — Telephone Encounter (Signed)
Done

## 2019-11-11 ENCOUNTER — Telehealth: Payer: Self-pay | Admitting: *Deleted

## 2019-11-11 NOTE — Telephone Encounter (Signed)
Ok to extended with verbal orders to continue home health.

## 2019-11-11 NOTE — Telephone Encounter (Signed)
Called and spoke with Nunzio Cory at home health and gave verbal order. Will call back with any issues.

## 2019-11-11 NOTE — Telephone Encounter (Signed)
Home Health left vm requesting extended nursing services.  Routing to covering provider.

## 2019-11-16 ENCOUNTER — Telehealth: Payer: Self-pay

## 2019-11-16 NOTE — Telephone Encounter (Signed)
Dr. Madilyn Fireman is covering [place] today.

## 2019-11-16 NOTE — Telephone Encounter (Signed)
Katherine Griffith with [facility] called and LVM stating she was at the pt's house, her BP was 150/68, HR 105, normal sinus rhythm, HR jumps to 120 when ambulating, heavy breathing but lungs are clear. She is wanting guidance in regards to what to do, I.e., UA, something for anxiety, if this could be related to her dementia.   Katherine Griffith can be reached at [phone]

## 2019-11-17 ENCOUNTER — Telehealth: Payer: Self-pay | Admitting: Physician Assistant

## 2019-11-17 ENCOUNTER — Ambulatory Visit (INDEPENDENT_AMBULATORY_CARE_PROVIDER_SITE_OTHER): Payer: Medicare HMO | Admitting: Medical-Surgical

## 2019-11-17 ENCOUNTER — Other Ambulatory Visit: Payer: Self-pay

## 2019-11-17 ENCOUNTER — Encounter: Payer: Self-pay | Admitting: Medical-Surgical

## 2019-11-17 ENCOUNTER — Other Ambulatory Visit: Payer: Self-pay | Admitting: Medical-Surgical

## 2019-11-17 VITALS — BP 125/75 | HR 88 | Wt 124.0 lb

## 2019-11-17 DIAGNOSIS — R34 Anuria and oliguria: Secondary | ICD-10-CM

## 2019-11-17 DIAGNOSIS — I878 Other specified disorders of veins: Secondary | ICD-10-CM | POA: Insufficient documentation

## 2019-11-17 DIAGNOSIS — R8281 Pyuria: Secondary | ICD-10-CM

## 2019-11-17 DIAGNOSIS — R000 Tachycardia, unspecified: Secondary | ICD-10-CM | POA: Diagnosis not present

## 2019-11-17 DIAGNOSIS — E860 Dehydration: Secondary | ICD-10-CM | POA: Diagnosis not present

## 2019-11-17 LAB — POCT URINALYSIS DIP (CLINITEK)
Bilirubin, UA: NEGATIVE
Blood, UA: NEGATIVE
Glucose, UA: NEGATIVE mg/dL
Ketones, POC UA: NEGATIVE mg/dL
Nitrite, UA: NEGATIVE
POC PROTEIN,UA: NEGATIVE
Spec Grav, UA: >=1.03 — AB (ref 1.010–1.025)
Urobilinogen, UA: 1 E.U./dL
pH, UA: 5.5 (ref 5.0–8.0)

## 2019-11-17 MED ORDER — NITROFURANTOIN MONOHYD MACRO 100 MG PO CAPS: 100.0000 mg | ORAL_CAPSULE | Freq: Two times a day (BID) | ORAL | 0 refills | Status: AC

## 2019-11-17 NOTE — Progress Notes (Signed)
Subjective:    CC: Elevated heart rate  HPI: 79 year old female presenting today with reports of elevated heart rate, increased confusion, decreased urinary output, and diarrhea x2 days.  She is having difficulty sleeping, very restless.  Some heavy breathing noted in afternoons.  Daughter serves as historian, reports patient is eating well for her but not great.  Has had increased confusion since Covid infection in December.  Speech has worsened since COVID infection in December and now mostly nonverbal.  No nausea/vomiting or fever.  I reviewed the past medical history, family history, social history, surgical history, and allergies today and no changes were needed.  Please see the problem list section below in epic for further details.  Past Medical History: Past Medical History:  Diagnosis Date  . Arthritis   . Back pain 08/15/2014  . DDD (degenerative disc disease) 03/26/2014  . H/O measles   . H/O mumps   . History of chicken pox   . History of tobacco use 03/26/2014  . Hyperlipidemia   . Hypothyroid   . Insomnia 03/26/2014  . Neurocognitive disorder   . Thyroid disease 01-01-14   Past Surgical History: Past Surgical History:  Procedure Laterality Date  . ABDOMINAL HYSTERECTOMY  79 yrs old   partial  . FRACTURE SURGERY    . FRACTURE SURGERY  10/06/2018   T-9 Outpatient procedure  . herniated disc  2005  . TONSILLECTOMY  50 yrs ago  . VARICOSE VEIN SURGERY Bilateral   . WISDOM TOOTH EXTRACTION     Social History: Social History   Socioeconomic History  . Marital status: Divorced    Spouse name: Not on file  . Number of children: Not on file  . Years of education: Not on file  . Highest education level: Not on file  Occupational History  . Not on file  Tobacco Use  . Smoking status: Former Smoker    Packs/day: 1.50    Years: 53.00    Pack years: 79.50    Types: Cigarettes    Start date: 10/22/2010    Quit date: 11/14/2017    Years since quitting: 2.0  . Smokeless  tobacco: Never Used  Substance and Sexual Activity  . Alcohol use: No  . Drug use: Never  . Sexual activity: Not Currently    Birth control/protection: Surgical, Post-menopausal  Other Topics Concern  . Not on file  Social History Narrative  . Not on file   Social Determinants of Health   Financial Resource Strain:   . Difficulty of Paying Living Expenses: Not on file  Food Insecurity:   . Worried About Charity fundraiser in the Last Year: Not on file  . Ran Out of Food in the Last Year: Not on file  Transportation Needs:   . Lack of Transportation (Medical): Not on file  . Lack of Transportation (Non-Medical): Not on file  Physical Activity:   . Days of Exercise per Week: Not on file  . Minutes of Exercise per Session: Not on file  Stress:   . Feeling of Stress : Not on file  Social Connections:   . Frequency of Communication with Friends and Family: Not on file  . Frequency of Social Gatherings with Friends and Family: Not on file  . Attends Religious Services: Not on file  . Active Member of Clubs or Organizations: Not on file  . Attends Archivist Meetings: Not on file  . Marital Status: Not on file   Family History: Family History  Problem Relation Age of Onset  . Cancer Father   . Leukemia Father   . Cataracts Paternal Grandmother   . Colon cancer Neg Hx   . Esophageal cancer Neg Hx    Allergies: Allergies  Allergen Reactions  . Remeron [Mirtazapine] Other (See Comments)    Increased weakness  . Zyprexa [Olanzapine]     insomnia  . Codeine Nausea Only   Medications: See med rec.  Review of Systems: No fevers, chills, night sweats, weight loss, chest pain, or shortness of breath.   Objective:    General: Well Developed, well nourished, and in no acute distress.  Neuro: Alert, confused, restless, nonverbal with occasional nonsensical sounds.   HEENT: Normocephalic, atraumatic.  Skin: Warm and dry. Cardiac: Regular rate and rhythm, no  murmurs rubs or gallops, no lower extremity edema.  Respiratory: Clear to auscultation bilaterally. Not using accessory muscles, speaking in full sentences.  EKG: rate 106, Sinus tachycardia, no acute changes; suboptimal tracing with patient's increased restlessness.  Impression and Recommendations:    Elevated heart rate Intermittent heart rate elevation in conjunction with decreased urinary output and 2 days of diarrhea strongly suspicious for dehydration.  IV access difficult with multiple attempts necessary.  Able to infuse 500 mL of lactated Ringer's before IV access lost.  Potential for UTI, efforts to obtain urine specimen in office unsuccessful.  Daughter provided with specimen hat and cup with instructions on obtaining urine specimen from home.  Once obtained we will run a urinalysis and send for culture if indicated.  Advised to push oral fluids and monitor output. Labs not obtained due to difficulty of IV access and increased patient restlessness.  If Cyndra does not void in a 24-hour period or develops worsening confusion/agitation, fever, or lethargy, please seek emergency care immediately.  Discussed establishing a primary care provider to help manage chronic illnesses and current condition requirements.  Advised to schedule appointment to establish care with either Dr. Sheppard Coil or Dr. Zigmund Daniel.  Addend: urine positive for leukocytes. Sending for culture. Treating empirically with Macrobid 100mg  BID x 7 days.   Return if symptoms worsen or fail to improve.  ___________________________________________ Clearnce Sorrel, DNP, APRN, FNP-BC Primary [facility] and [place]

## 2019-11-17 NOTE — Assessment & Plan Note (Signed)
This is a pleasant 79 year old female with dehydration, she has poor venous access and I am called for further evaluation and interventional/definitive treatment for access to her venous system. She needs IV fluids per primary treating provider. Initial blind stick into the left cubital fossa was unsuccessful, subsequently we did to ultrasound-guided sticks into the left and then the right cephalic veins, the left side blue, I was able to get good venous blood return into the syringe, we were able to infuse some fluids, and then the vein infiltrated as well.  A total of approximately 500 cc was successfully infused.

## 2019-11-17 NOTE — Progress Notes (Signed)
    Procedures performed today:    Procedure: Real-time Ultrasound Guided  angiocatheter placed in the right cephalic vein Device: Samsung HS60  Verbal informed consent obtained.  Time-out conducted.  Noted no overlying erythema, induration, or other signs of local infection.  Skin prepped in a sterile fashion.  Local anesthesia: Topical Ethyl chloride.  With sterile technique and under real time ultrasound guidance:  20-gauge angiocatheter advanced into the right cephalic vein, we confirmed good venous blood return followed by injection of saline.   Completed without difficulty  Advised to call if fevers/chills, erythema, induration, drainage, or persistent bleeding.  Images permanently stored and available for review in the ultrasound unit.  Impression: Technically successful ultrasound guided venous catheter placement.  Independent interpretation of tests performed by another provider:   None.  Impression and Recommendations:    Poor venous access This is a pleasant 79 year old female with dehydration, she has poor venous access and I am called for further evaluation and interventional/definitive treatment for access to her venous system. She needs IV fluids per primary treating provider. Initial blind stick into the left cubital fossa was unsuccessful, subsequently we did to ultrasound-guided sticks into the left and then the right cephalic veins, the left side blue, I was able to get good venous blood return into the syringe, we were able to infuse some fluids, and then the vein infiltrated as well.  A total of approximately 500 cc was successfully infused.    ___________________________________________ Gwen Her. Dianah Field, M.D., ABFM., CAQSM. Primary Care and [place] [employer] of [employer] [facility] [place] [facility]

## 2019-11-17 NOTE — Telephone Encounter (Signed)
Pt is scheduled for an office visit this morning at 11:10 AM. Will do a 6 min walk test while she is here.

## 2019-11-17 NOTE — Telephone Encounter (Signed)
Patient's daughter Milagros Reap) was questioning patient's EKG results and would like a call back regarding this, please advise.

## 2019-11-17 NOTE — Telephone Encounter (Signed)
See if they could try to do a 6 min walk test to see if she is dropping her oxygen and that has causing her HR to jump up.

## 2019-11-17 NOTE — Telephone Encounter (Signed)
Thank you so much for dropping the urine specimen by! Katherine Griffith's urine has some leukocytes in it which is indicative of a likely urinary tract infection.  We will send this for culture but with her current symptoms I would like to go ahead and treat her for UTI.  I will send in [place] for her.  She will take this twice a day for 7 days.  Her urine also is showing an increased specific gravity which indicates that she is dehydrated.  Continue pushing oral fluids as she will tolerate.  The EKG result that we got in the office was a bit hard to read due to her restlessness.  We did have a small area on the EKG that was readable.  Her heart rate was 106 and it looked good overall with no significant areas of concern.  This could easily be explained by dehydration and a possible UTI.  My apologies for not covering the result with you in office.  Please let us know if you have any questions.  Take care, Katherine Griffith

## 2019-11-23 MED ORDER — MELATONIN 3 MG PO TABS
6.00 | ORAL_TABLET | ORAL | Status: DC
Start: 2019-11-26 — End: 2019-11-23

## 2019-11-23 MED ORDER — GENERIC EXTERNAL MEDICATION
12.50 | Status: DC
Start: 2019-11-23 — End: 2019-11-23

## 2019-11-23 MED ORDER — ALUM & MAG HYDROXIDE-SIMETH 200-200-20 MG/5ML PO SUSP
30.00 | ORAL | Status: DC
Start: ? — End: 2019-11-23

## 2019-11-23 MED ORDER — ASPIRIN 81 MG PO CHEW
81.00 | CHEWABLE_TABLET | ORAL | Status: DC
Start: 2019-11-27 — End: 2019-11-23

## 2019-11-23 MED ORDER — ACETAMINOPHEN 325 MG PO TABS
650.00 | ORAL_TABLET | ORAL | Status: DC
Start: ? — End: 2019-11-23

## 2019-11-23 MED ORDER — NITROGLYCERIN 0.4 MG SL SUBL
0.40 | SUBLINGUAL_TABLET | SUBLINGUAL | Status: DC
Start: ? — End: 2019-11-23

## 2019-11-23 MED ORDER — CYANOCOBALAMIN 1000 MCG/ML IJ SOLN
1000.00 | INTRAMUSCULAR | Status: DC
Start: 2019-11-25 — End: 2019-11-23

## 2019-11-23 MED ORDER — ONDANSETRON HCL 4 MG/2ML IJ SOLN
4.00 | INTRAMUSCULAR | Status: DC
Start: ? — End: 2019-11-23

## 2019-11-23 MED ORDER — QUINTABS PO TABS
1.00 | ORAL_TABLET | ORAL | Status: DC
Start: 2019-11-27 — End: 2019-11-23

## 2019-11-23 MED ORDER — SODIUM CHLORIDE FLUSH 0.9 % IV SOLN
5.00 | INTRAVENOUS | Status: DC
Start: 2019-11-26 — End: 2019-11-23

## 2019-11-23 MED ORDER — SODIUM CHLORIDE 0.9 % IV SOLN
INTRAVENOUS | Status: DC
Start: ? — End: 2019-11-23

## 2019-11-23 MED ORDER — ACETAMINOPHEN 160 MG/5ML PO SOLN
650.00 | ORAL | Status: DC
Start: ? — End: 2019-11-23

## 2019-11-23 MED ORDER — TRAMADOL HCL 50 MG PO TABS
50.00 | ORAL_TABLET | ORAL | Status: DC
Start: ? — End: 2019-11-23

## 2019-11-23 MED ORDER — BISACODYL 5 MG PO TBEC
10.00 | DELAYED_RELEASE_TABLET | ORAL | Status: DC
Start: ? — End: 2019-11-23

## 2019-11-23 MED ORDER — DEXTROMETHORPHAN-GUAIFENESIN 10-100 MG/5ML PO LIQD
5.00 | ORAL | Status: DC
Start: ? — End: 2019-11-23

## 2019-11-23 MED ORDER — SODIUM CHLORIDE FLUSH 0.9 % IV SOLN
5.00 | INTRAVENOUS | Status: DC
Start: ? — End: 2019-11-23

## 2019-11-23 MED ORDER — ERGOCALCIFEROL 1.25 MG (50000 UT) PO CAPS
50000.00 | ORAL_CAPSULE | ORAL | Status: DC
Start: 2019-11-27 — End: 2019-11-23

## 2019-11-23 MED ORDER — ATORVASTATIN CALCIUM 10 MG PO TABS
10.00 | ORAL_TABLET | ORAL | Status: DC
Start: 2019-11-27 — End: 2019-11-23

## 2019-11-23 MED ORDER — ENOXAPARIN SODIUM 40 MG/0.4ML ~~LOC~~ SOLN
40.00 | SUBCUTANEOUS | Status: DC
Start: 2019-11-26 — End: 2019-11-23

## 2019-11-23 MED ORDER — ALBUTEROL SULFATE HFA 108 (90 BASE) MCG/ACT IN AERS
2.00 | INHALATION_SPRAY | RESPIRATORY_TRACT | Status: DC
Start: ? — End: 2019-11-23

## 2019-11-23 MED ORDER — PANCRELIPASE (LIP-PROT-AMYL) 24000-76000 UNITS PO CPEP
72000.00 | ORAL_CAPSULE | ORAL | Status: DC
Start: 2019-11-26 — End: 2019-11-23

## 2019-11-23 MED ORDER — INFLUENZA VAC HIGH-DOSE QUAD 0.7 ML IM SUSY
0.70 | PREFILLED_SYRINGE | INTRAMUSCULAR | Status: DC
Start: ? — End: 2019-11-23

## 2019-11-23 MED ORDER — MEGESTROL ACETATE 40 MG/ML PO SUSP
200.00 | ORAL | Status: DC
Start: 2019-11-27 — End: 2019-11-23

## 2019-11-23 MED ORDER — LEVOTHYROXINE SODIUM 50 MCG PO TABS
50.00 | ORAL_TABLET | ORAL | Status: DC
Start: 2019-11-27 — End: 2019-11-23

## 2019-11-24 LAB — URINE CULTURE
MICRO NUMBER:: 10082167
Result:: NO GROWTH
SPECIMEN QUALITY:: ADEQUATE

## 2019-11-24 LAB — HOUSE ACCOUNT TRACKING

## 2019-11-24 MED ORDER — GENERIC EXTERNAL MEDICATION
Status: DC
Start: ? — End: 2019-11-24

## 2019-11-24 MED ORDER — ALBUTEROL SULFATE (2.5 MG/3ML) 0.083% IN NEBU
2.50 | INHALATION_SOLUTION | RESPIRATORY_TRACT | Status: DC
Start: ? — End: 2019-11-24

## 2019-11-24 MED ORDER — GENERIC EXTERNAL MEDICATION
37.50 | Status: DC
Start: 2019-11-26 — End: 2019-11-24

## 2019-12-31 ENCOUNTER — Other Ambulatory Visit: Payer: Self-pay | Admitting: Physician Assistant

## 2020-01-21 DEATH — deceased

## 2021-06-26 IMAGING — CT CT ABDOMEN WITHOUT AND WITH CONTRAST
2 of 13 series · 9 of 46 positions shown, 15 images · IV contrast (APPLIED)
Comparison: None.

CLINICAL DATA: Chronic diarrhea. Abnormal pancreatic elastase
level. Evaluate for pancreatic abnormality.

EXAM:
CT ABDOMEN WITHOUT AND WITH CONTRAST
TECHNIQUE: Multidetector CT imaging of the abdomen was performed following the
standard protocol before and following the bolus administration of
intravenous contrast.
CONTRAST:  100mL OMNIPAQUE IOHEXOL 300 MG/ML  SOLN

[Series 9: portal thin · axial · portal-venous · 0.70mm/px · z∈[+1525,+1667]mm · 6 of 101 slices shown, 11 images]
[im 15/101  soft-tissue]
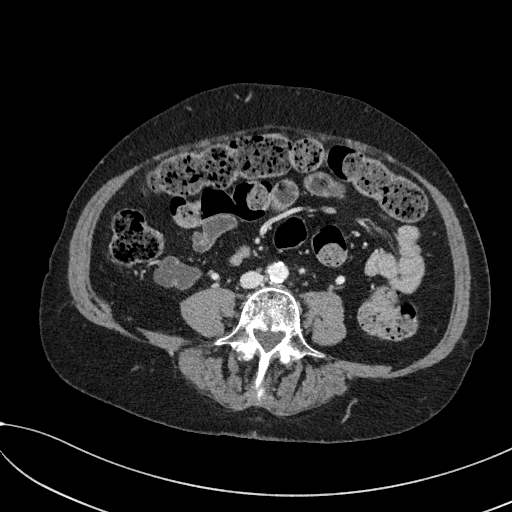
[im 15/101  bone]
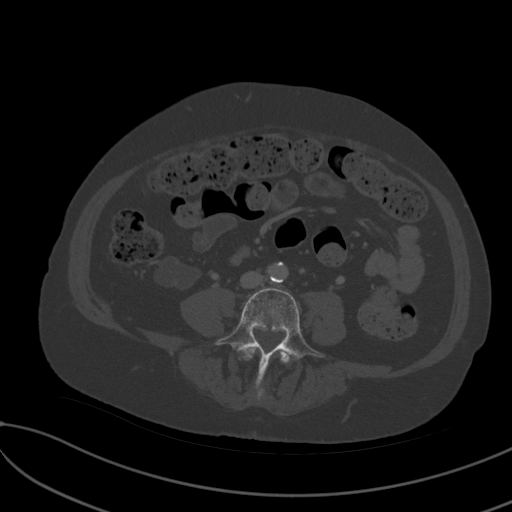
[im 29/101  soft-tissue]
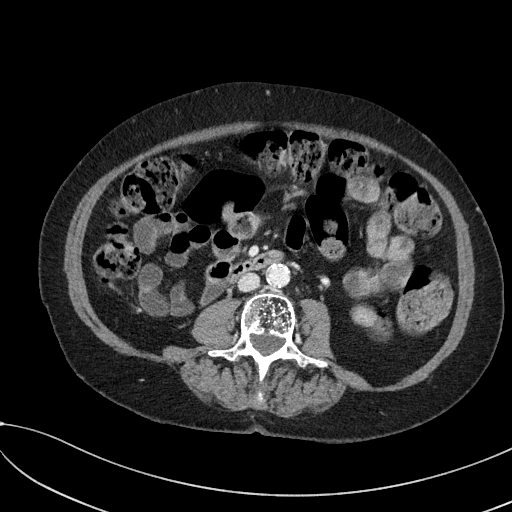
[im 43/101  soft-tissue]
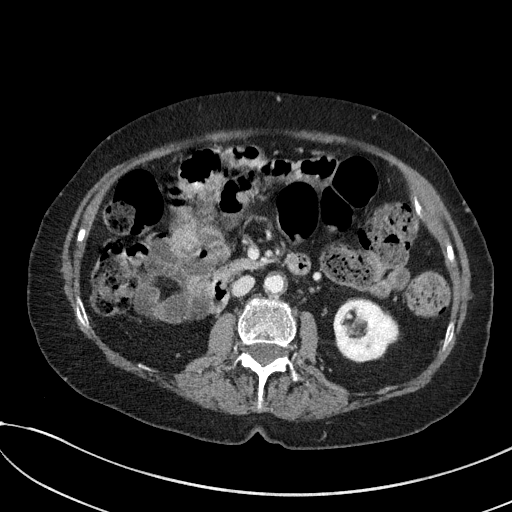
[im 43/101  lung]
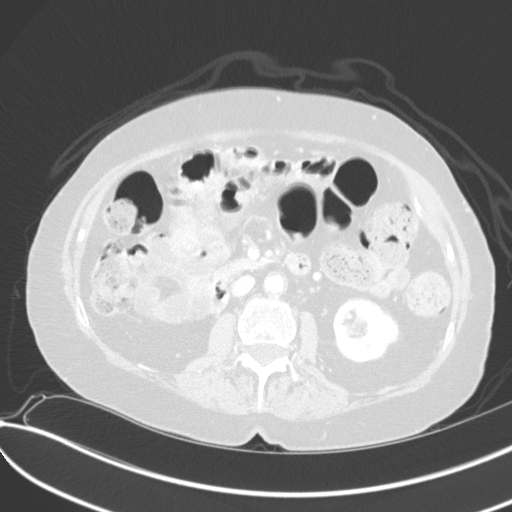
[im 58/101  soft-tissue]
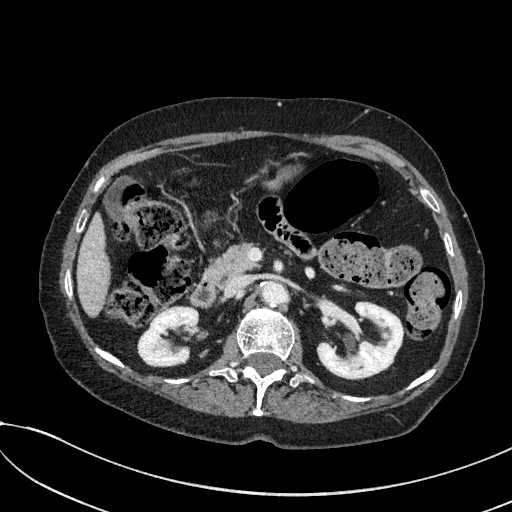
[im 58/101  lung]
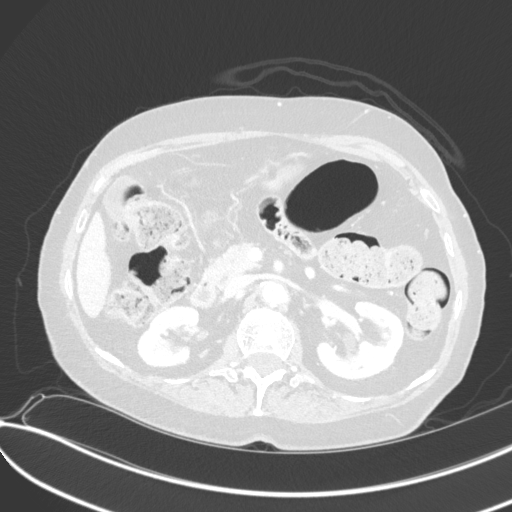
[im 72/101  soft-tissue]
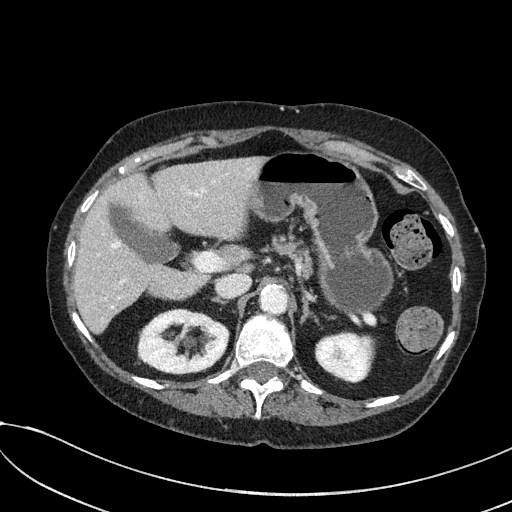
[im 72/101  lung]
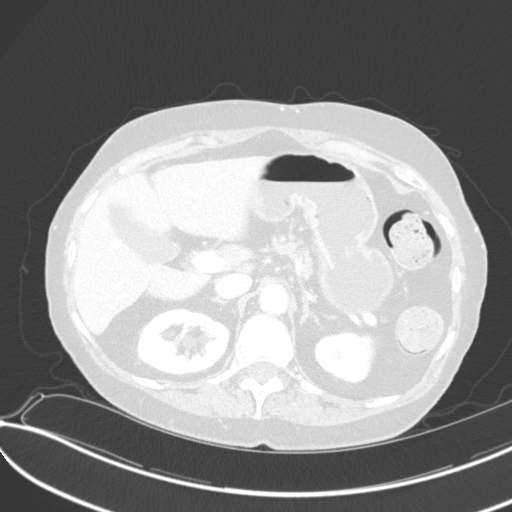
[im 86/101  soft-tissue]
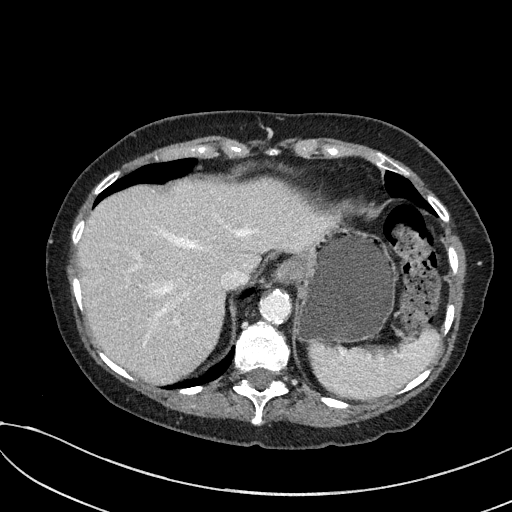
[im 86/101  lung]
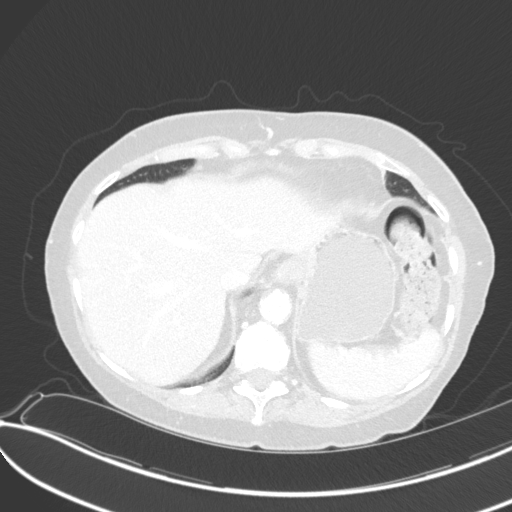

[Series 13: coronal arterial · coronal · arterial · 0.41mm/px · 3 of 86 slices shown, 4 images]
[im 22/86  soft-tissue]
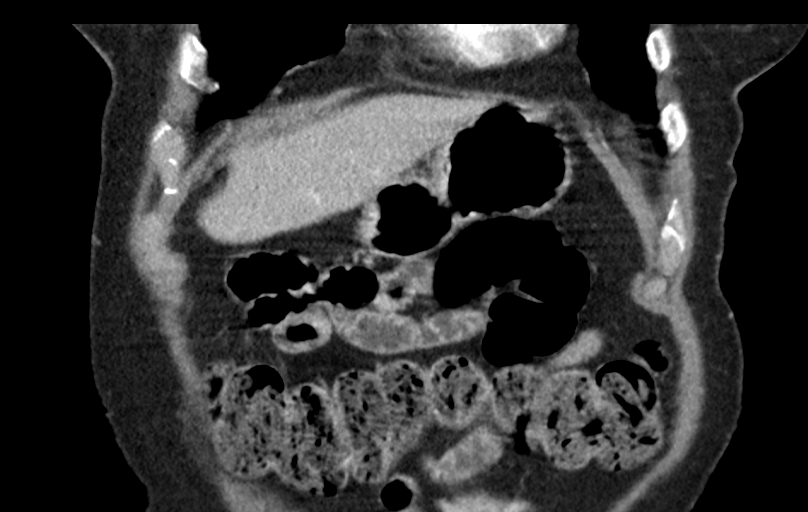
[im 43/86  soft-tissue]
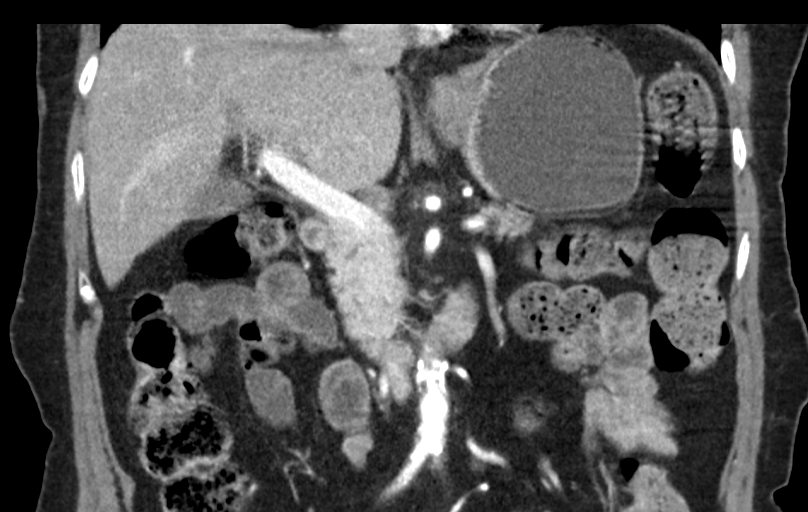
[im 43/86  bone]
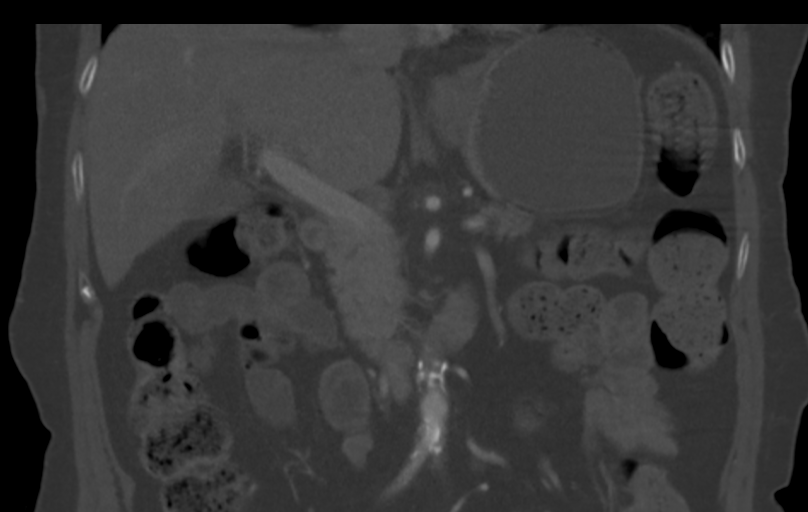
[im 64/86  soft-tissue]
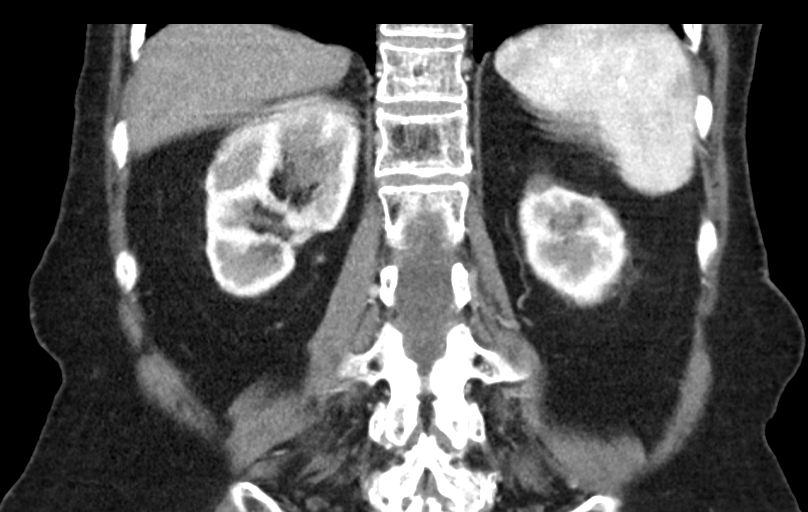

[9 of 46 positions shown; findings below may reference images not displayed]

FINDINGS: Lower chest: The lung bases are clear of acute process. No pleural
effusion or pulmonary lesions. The heart is normal in size. No
pericardial effusion. The distal esophagus and aorta are
unremarkable. Moderate atherosclerotic calcifications involving the
distal descending thoracic aorta.

Hepatobiliary: No focal hepatic lesions or intrahepatic biliary
dilatation. The portal and hepatic veins are patent. The gallbladder
is unremarkable. No common bile duct dilatation.

Pancreas: No pancreatic masses identified. Normal pancreatic
enhancement. No acute inflammation or ductal dilatation. Incidental
note is made of pancreatic divisum.

Spleen: Normal size.  No focal lesions.

Adrenals/Urinary Tract: The adrenal glands are normal.

Small scattered renal cysts but no worrisome renal lesions. The
delayed images do not demonstrate any significant collecting system
abnormalities. The visualized ureters appear normal.

Stomach/Bowel: The stomach, duodenum, small bowel and colon are
unremarkable. No acute inflammatory changes, mass lesions or
obstructive findings. Moderate stool noted throughout the colon.

Vascular/Lymphatic: Moderate to advanced atherosclerotic
calcifications involving the aorta and iliac arteries. The aortic
branch vessels are patent. Moderate calcifications at the renal
artery ostia.

No mesenteric or retroperitoneal mass or adenopathy.

Other: No ascites or abdominal wall hernia.

Musculoskeletal: No significant bony findings. Multilevel vertebral
body hemangiomas are noted.
IMPRESSION: 1. Normal CT appearance of the pancreas. No pancreatic mass or
inflammation or ductal dilatation.
2. Incidental pancreatic divisum.
3. No acute abdominal findings, mass lesions or adenopathy.
4. Moderate to advanced atherosclerotic changes involving the aorta
and branch vessels.
5. Moderate stool throughout the visualized colon could suggest
constipation.
